# Patient Record
Sex: Female | Born: 1945
Health system: Southern US, Community
[De-identification: ages and names within clinical notes are randomized; demographics above are authoritative.]

## PROBLEM LIST (undated history)

## (undated) DIAGNOSIS — M8580 Other specified disorders of bone density and structure, unspecified site: Secondary | ICD-10-CM

## (undated) DIAGNOSIS — F32A Depression, unspecified: Secondary | ICD-10-CM

## (undated) DIAGNOSIS — D219 Benign neoplasm of connective and other soft tissue, unspecified: Secondary | ICD-10-CM

## (undated) DIAGNOSIS — G43909 Migraine, unspecified, not intractable, without status migrainosus: Secondary | ICD-10-CM

## (undated) DIAGNOSIS — F419 Anxiety disorder, unspecified: Secondary | ICD-10-CM

## (undated) DIAGNOSIS — C4491 Basal cell carcinoma of skin, unspecified: Secondary | ICD-10-CM

## (undated) DIAGNOSIS — K222 Esophageal obstruction: Secondary | ICD-10-CM

## (undated) DIAGNOSIS — Z9889 Other specified postprocedural states: Secondary | ICD-10-CM

## (undated) DIAGNOSIS — R112 Nausea with vomiting, unspecified: Secondary | ICD-10-CM

## (undated) DIAGNOSIS — I10 Essential (primary) hypertension: Secondary | ICD-10-CM

## (undated) DIAGNOSIS — K449 Diaphragmatic hernia without obstruction or gangrene: Secondary | ICD-10-CM

## (undated) DIAGNOSIS — F329 Major depressive disorder, single episode, unspecified: Secondary | ICD-10-CM

## (undated) DIAGNOSIS — K219 Gastro-esophageal reflux disease without esophagitis: Secondary | ICD-10-CM

## (undated) HISTORY — DX: Depression, unspecified: F32.A

## (undated) HISTORY — DX: Esophageal obstruction: K22.2

## (undated) HISTORY — DX: Migraine, unspecified, not intractable, without status migrainosus: G43.909

## (undated) HISTORY — DX: Benign neoplasm of connective and other soft tissue, unspecified: D21.9

## (undated) HISTORY — DX: Basal cell carcinoma of skin, unspecified: C44.91

## (undated) HISTORY — DX: Other specified disorders of bone density and structure, unspecified site: M85.80

## (undated) HISTORY — DX: Major depressive disorder, single episode, unspecified: F32.9

## (undated) HISTORY — PX: ESOPHAGEAL DILATION: SHX303

## (undated) HISTORY — PX: PARTIAL HYSTERECTOMY: SHX80

---

## 2000-02-16 ENCOUNTER — Encounter: Payer: Self-pay | Admitting: Internal Medicine

## 2000-02-16 ENCOUNTER — Ambulatory Visit (HOSPITAL_COMMUNITY): Admission: RE | Admit: 2000-02-16 | Discharge: 2000-02-16 | Payer: Self-pay | Admitting: Internal Medicine

## 2001-01-20 ENCOUNTER — Other Ambulatory Visit: Admission: RE | Admit: 2001-01-20 | Discharge: 2001-01-20 | Payer: Self-pay | Admitting: Obstetrics and Gynecology

## 2001-07-02 ENCOUNTER — Encounter: Admission: RE | Admit: 2001-07-02 | Discharge: 2001-07-02 | Payer: Self-pay | Admitting: Otolaryngology

## 2001-07-02 ENCOUNTER — Encounter: Payer: Self-pay | Admitting: Otolaryngology

## 2002-01-22 ENCOUNTER — Other Ambulatory Visit: Admission: RE | Admit: 2002-01-22 | Discharge: 2002-01-22 | Payer: Self-pay | Admitting: Obstetrics and Gynecology

## 2003-02-22 ENCOUNTER — Other Ambulatory Visit: Admission: RE | Admit: 2003-02-22 | Discharge: 2003-02-22 | Payer: Self-pay | Admitting: Obstetrics and Gynecology

## 2003-04-26 ENCOUNTER — Encounter: Payer: Self-pay | Admitting: Internal Medicine

## 2004-04-17 ENCOUNTER — Ambulatory Visit: Payer: Self-pay | Admitting: *Deleted

## 2004-04-28 ENCOUNTER — Ambulatory Visit: Payer: Self-pay | Admitting: *Deleted

## 2004-05-08 ENCOUNTER — Ambulatory Visit: Payer: Self-pay | Admitting: *Deleted

## 2004-05-15 ENCOUNTER — Ambulatory Visit: Payer: Self-pay | Admitting: Internal Medicine

## 2004-05-15 ENCOUNTER — Ambulatory Visit: Payer: Self-pay | Admitting: *Deleted

## 2004-05-23 ENCOUNTER — Ambulatory Visit: Payer: Self-pay | Admitting: *Deleted

## 2004-05-30 ENCOUNTER — Ambulatory Visit: Payer: Self-pay | Admitting: *Deleted

## 2004-06-19 ENCOUNTER — Ambulatory Visit: Payer: Self-pay | Admitting: Internal Medicine

## 2004-06-19 ENCOUNTER — Ambulatory Visit: Payer: Self-pay | Admitting: *Deleted

## 2004-06-26 ENCOUNTER — Ambulatory Visit: Payer: Self-pay | Admitting: *Deleted

## 2004-06-30 ENCOUNTER — Ambulatory Visit: Payer: Self-pay | Admitting: Internal Medicine

## 2004-08-21 ENCOUNTER — Ambulatory Visit: Payer: Self-pay | Admitting: Internal Medicine

## 2006-06-28 ENCOUNTER — Ambulatory Visit: Payer: Self-pay | Admitting: Internal Medicine

## 2006-07-11 ENCOUNTER — Ambulatory Visit: Payer: Self-pay | Admitting: Internal Medicine

## 2006-11-16 ENCOUNTER — Emergency Department (HOSPITAL_COMMUNITY): Admission: EM | Admit: 2006-11-16 | Discharge: 2006-11-16 | Payer: Self-pay | Admitting: Emergency Medicine

## 2006-11-16 ENCOUNTER — Ambulatory Visit: Payer: Self-pay | Admitting: Surgery

## 2006-11-16 ENCOUNTER — Encounter (INDEPENDENT_AMBULATORY_CARE_PROVIDER_SITE_OTHER): Payer: Self-pay | Admitting: Emergency Medicine

## 2007-06-09 ENCOUNTER — Encounter: Payer: Self-pay | Admitting: Internal Medicine

## 2011-09-13 ENCOUNTER — Emergency Department (HOSPITAL_COMMUNITY)
Admission: EM | Admit: 2011-09-13 | Discharge: 2011-09-13 | Disposition: A | Discharge status: Home / Self Care | Payer: Medicare Other | Attending: Emergency Medicine | Admitting: Emergency Medicine

## 2011-09-13 ENCOUNTER — Encounter (HOSPITAL_COMMUNITY): Payer: Self-pay | Admitting: *Deleted

## 2011-09-13 ENCOUNTER — Emergency Department (HOSPITAL_COMMUNITY): Payer: Medicare Other

## 2011-09-13 DIAGNOSIS — I10 Essential (primary) hypertension: Secondary | ICD-10-CM | POA: Insufficient documentation

## 2011-09-13 DIAGNOSIS — R1013 Epigastric pain: Secondary | ICD-10-CM

## 2011-09-13 DIAGNOSIS — F411 Generalized anxiety disorder: Secondary | ICD-10-CM | POA: Insufficient documentation

## 2011-09-13 DIAGNOSIS — Z79899 Other long term (current) drug therapy: Secondary | ICD-10-CM | POA: Insufficient documentation

## 2011-09-13 DIAGNOSIS — K219 Gastro-esophageal reflux disease without esophagitis: Secondary | ICD-10-CM | POA: Insufficient documentation

## 2011-09-13 HISTORY — DX: Gastro-esophageal reflux disease without esophagitis: K21.9

## 2011-09-13 HISTORY — DX: Essential (primary) hypertension: I10

## 2011-09-13 HISTORY — DX: Diaphragmatic hernia without obstruction or gangrene: K44.9

## 2011-09-13 HISTORY — DX: Anxiety disorder, unspecified: F41.9

## 2011-09-13 LAB — CBC
HCT: 40.2 % (ref 36.0–46.0)
MCH: 29 pg (ref 26.0–34.0)
MCV: 87 fL (ref 78.0–100.0)
Platelets: 287 10*3/uL (ref 150–400)
RBC: 4.62 MIL/uL (ref 3.87–5.11)
WBC: 7.3 10*3/uL (ref 4.0–10.5)

## 2011-09-13 LAB — HEPATIC FUNCTION PANEL
ALT: 13 U/L (ref 0–35)
AST: 24 U/L (ref 0–37)
Albumin: 4.9 g/dL (ref 3.5–5.2)
Alkaline Phosphatase: 100 U/L (ref 39–117)
Total Bilirubin: 0.4 mg/dL (ref 0.3–1.2)
Total Protein: 8.3 g/dL (ref 6.0–8.3)

## 2011-09-13 LAB — BASIC METABOLIC PANEL
BUN: 14 mg/dL (ref 6–23)
CO2: 31 mEq/L (ref 19–32)
Calcium: 10.4 mg/dL (ref 8.4–10.5)
Chloride: 98 mEq/L (ref 96–112)
Creatinine, Ser: 0.85 mg/dL (ref 0.50–1.10)
Glucose, Bld: 102 mg/dL — ABNORMAL HIGH (ref 70–99)

## 2011-09-13 LAB — POCT I-STAT TROPONIN I: Troponin i, poc: 0.01 ng/mL (ref 0.00–0.08)

## 2011-09-13 MED ORDER — GI COCKTAIL ~~LOC~~
30.0000 mL | Freq: Once | ORAL | Status: AC
  Administered 2011-09-13: 30 mL via ORAL
  Filled 2011-09-13: qty 30

## 2011-09-13 NOTE — ED Provider Notes (Signed)
History     CSN: [id number]  Arrival date & time 09/13/11  1245   First MD Initiated Contact with Patient 09/13/11 1709      Chief Complaint  Patient presents with  . Heartburn  . Chest Pain    In addition to what is listed below. Patient is a 66 year old female with past medical history relevant for hypertension, hiatal hernia, anxiety who presents with two-week history of epigastric discomfort.  Patient states that for 2 weeks she has had constant, indigestion-like feeling, in her epigastric region. Associated symptoms include bad taste in the back of her mouth but no others. Patient reports being diagnosed with hiatal hernia 15 years ago and current presentation is similar other than now it's more severe and longer in duration. Patient seen at outside clinic and there was concern for possible cardiac etiology. Thus she was sent to the emergency department  (Consider location/radiation/quality/duration/timing/severity/associated sxs/prior treatment) Patient is a 66 y.o. female presenting with abdominal pain. The history is provided by the patient and medical records.  Abdominal Pain The primary symptoms of the illness include abdominal pain. The current episode started more than 2 days ago. The onset of the illness was gradual. The problem has not changed since onset. The abdominal pain began more than 2 days ago. The pain came on gradually. The abdominal pain has been gradually worsening since its onset. The abdominal pain is located in the epigastric region. The abdominal pain does not radiate. The severity of the abdominal pain is 3/10. The abdominal pain is exacerbated by fatty foods and certain positions.  The patient states that she believes she is currently not pregnant. The patient has not had a change in bowel habit. Additional symptoms associated with the illness include heartburn.    Past Medical History  Diagnosis Date  . Hypertension   . GERD (gastroesophageal reflux  disease)   . Hiatal hernia   . Anxiety     Past Surgical History  Procedure Date  . Partial hysterectomy   . Esophageal dilation     No family history on file.  History  Substance Use Topics  . Smoking status: Never Smoker   . Smokeless tobacco: Not on file  . Alcohol Use: No    OB History    Grav Para Term Preterm Abortions TAB SAB Ect Mult Living                  Review of Systems  Gastrointestinal: Positive for heartburn and abdominal pain.  All other systems reviewed and are negative.    Allergies  Augmentin and Lipitor  Home Medications   Current Outpatient Rx  Name Route Sig Dispense Refill  . VITAMIN D 1000 UNITS PO TABS Oral Take 1,000 Units by mouth daily.    Marland Kitchen LISINOPRIL-HYDROCHLOROTHIAZIDE 10-12.5 MG PO TABS Oral Take 1 tablet by mouth daily.    Marland Kitchen FISH OIL 500 MG PO CAPS Oral Take 1 capsule by mouth 2 (two) times daily.    Marland Kitchen OMEPRAZOLE 20 MG PO CPDR Oral Take 20 mg by mouth daily.    Marland Kitchen PRAVASTATIN SODIUM 40 MG PO TABS Oral Take 40 mg by mouth daily.    . VENLAFAXINE HCL ER 37.5 MG PO CP24 Oral Take 37.5 mg by mouth daily.    Marland Kitchen ZOLPIDEM TARTRATE 5 MG PO TABS Oral Take 5 mg by mouth at bedtime as needed. For sleep      BP 149/66  Pulse 75  Temp 98.4 F (36.9 C) (Oral)  Resp 16  Ht 5\' 4"  (1.626 m)  Wt 130 lb (58.968 kg)  BMI 22.31 kg/m2  SpO2 100%  Physical Exam  Nursing note and vitals reviewed. Constitutional: She is oriented to person, place, and time. She appears well-developed and well-nourished. No distress.  HENT:  Head: Normocephalic and atraumatic.  Right Ear: External ear normal.  Left Ear: External ear normal.  Nose: Nose normal.  Mouth/Throat: Oropharynx is clear and moist.  Eyes: Conjunctivae normal and EOM are normal. Pupils are equal, round, and reactive to light.  Neck: Normal range of motion. Neck supple.  Cardiovascular: Normal rate, regular rhythm, normal heart sounds and intact distal pulses.   No murmur  heard. Pulmonary/Chest: Effort normal and breath sounds normal.  Abdominal: Soft. Bowel sounds are normal. She exhibits no distension and no mass. There is tenderness (mild TTP over epigastric region). There is no rebound and no guarding.  Musculoskeletal: Normal range of motion. She exhibits no edema and no tenderness.  Neurological: She is alert and oriented to person, place, and time. She has normal reflexes.  Skin: Skin is warm and dry.  Psychiatric: She has a normal mood and affect.    ED Course  Procedures (including critical care time)  Labs Reviewed  BASIC METABOLIC PANEL - Abnormal; Notable for the following:    Potassium 3.4 (*)     Glucose, Bld 102 (*)     GFR calc non Af Amer 69 (*)     GFR calc Af Amer 80 (*)     All other components within normal limits  CBC  POCT I-STAT TROPONIN I  LIPASE, BLOOD  HEPATIC FUNCTION PANEL  LAB REPORT - SCANNED   Dg Chest 2 View  09/13/2011  *RADIOLOGY REPORT*  Clinical Data: Intermittent chest pressure and heartburn for the past 2 weeks.  CHEST - 2 VIEW  Comparison: None.  Findings: Normal sized heart.  Clear lungs.  The lungs are hyperexpanded with mildly prominent interstitial markings. Thoracic spine degenerative changes.  The bones appear osteopenic.  IMPRESSION:  1.  No acute abnormality. 2.  COPD.   Original Report Authenticated By: Darrol Angel, M.D.       Date: 09/13/2011  Rate: 82  Rhythm: normal sinus rhythm  QRS Axis: normal  Intervals: normal  ST/T Wave abnormalities: normal  Conduction Disutrbances:none  Narrative Interpretation:   Old EKG Reviewed: none available    1. Epigastric abdominal pain       MDM    Patient presents with epigastric discomfort for appx 2 weeks.  Seen at PCP's office prior to arrival and patient sent to ED for ACS evaluation.  AF and VS wnl.  On exam, patient with mild epigastric TTP but otherwise unremarkable.  Due to age, risk factors, and concern for atypical presentation for ACS  it was felt that workup was necessary.  EKG wnl.  CXR showed no acute cardiopulmonary abnormality.  Labs including, 4 hour delta troponin, were wnl.  Patient given GI cocktail which helped decrease symptoms.   Etiology of patient's symptoms not felt to be attributed to cardiac etiology.  More likely to be related to hiatal hernia/GI pathology.  Thus, patient instructed to continue PPI and follow up with PCP as soon as possible.  Patient discharged without acute events.         Johnney Ou, MD 09/14/11 [phone]

## 2011-09-13 NOTE — ED Notes (Addendum)
Patient reports she has had what she thought was  Hiatal hernia sx for 2 weeks.  She was seen today at [facility] clinic and advised to come to ED for cardiac eval.  Patient denies sob,  Denies n/v.  Patient has had periods of light headedness.  Patient denies feeling palpitations.  Patient has had upper back pain.  Patient states she has a bad taste in her mouth,  She has not eaten much due to increased sx when she eats

## 2011-09-13 NOTE — ED Notes (Signed)
Pt from [facility] in [place], was sent here by private vehicle for r/o CP. Elita Quick, PA at [facility] has spoken with Dr. Ignacia Palma and will be faxing office records.

## 2011-09-13 NOTE — ED Notes (Signed)
Patient remains alert and oriented.  No s/sx of distress.  Update with info noted.  Patient reports her MD should have faxed over info along with chest xray results

## 2011-09-17 NOTE — ED Provider Notes (Signed)
I saw and evaluated the patient, reviewed the resident's note and I agree with the findings and plan and agree with their ECG interpretation. Chest pain/epigastric pain. Doubt cardiac cause. Will d/c with follow up  Megan Mason. Rubin Payor, MD 09/17/11 1537

## 2012-01-02 HISTORY — PX: APPENDECTOMY: SHX54

## 2012-02-07 ENCOUNTER — Encounter: Payer: Self-pay | Admitting: Internal Medicine

## 2012-02-07 ENCOUNTER — Ambulatory Visit (INDEPENDENT_AMBULATORY_CARE_PROVIDER_SITE_OTHER): Payer: Medicare Other | Admitting: Internal Medicine

## 2012-02-07 VITALS — BP 106/62 | HR 60 | Ht 64.5 in | Wt 130.8 lb

## 2012-02-07 DIAGNOSIS — R438 Other disturbances of smell and taste: Secondary | ICD-10-CM

## 2012-02-07 DIAGNOSIS — R439 Unspecified disturbances of smell and taste: Secondary | ICD-10-CM

## 2012-02-07 DIAGNOSIS — K219 Gastro-esophageal reflux disease without esophagitis: Secondary | ICD-10-CM

## 2012-02-07 DIAGNOSIS — K589 Irritable bowel syndrome without diarrhea: Secondary | ICD-10-CM

## 2012-02-07 DIAGNOSIS — K222 Esophageal obstruction: Secondary | ICD-10-CM

## 2012-02-07 NOTE — Patient Instructions (Addendum)
Please follow up as needed 

## 2012-02-07 NOTE — Progress Notes (Signed)
HISTORY OF PRESENT ILLNESS:  Megan Mason is a 67 y.o. female with hypertension, anxiety, depression, migraine headaches, IBS, and GERD complicated by peptic stricture for which she underwent dilation in 2002. She is sent today regarding "a bad taste in my mouth". The patient states that this is her chief complaint. She is somewhat vague, but states that this has been going on for at least 8 years. No water brash, thrush, or new medications. She tells me that she saw him as and throat specialist last summer and was felt possibly to have GERD. PPIs have not improved this complaint. She also has ongoing chronic IBS symptoms as manifested by abdominal cramping with urgency and loose stools, often after meals. She also mentioned some occasional epigastric and substernal burning over the past 6 months. No dysphagia. No weight loss. No bleeding. She did undergo complete colonoscopy for the purposes of screening in July of 2008. This was normal except for sigmoid diverticulosis. She denies a history of neurologic problems. She has not had a neurology evaluation for this complaint. She tells me that she has had "lots of blood work" which has been unrevealing. The only outside record is an office note from September 2013. Testing for Helicobacter pylori was negative. Apparently complaining of abdominal pain at that time. She was also seen in the emergency room in September complaining of bad taste in her mouth and epigastric discomfort. Cardiac workup was negative. In addition, CBC, and comprehensive metabolic panel were unremarkable.  REVIEW OF SYSTEMS:  All non-GI ROS negative except for headaches  Past Medical History  Diagnosis Date  . Hypertension   . GERD (gastroesophageal reflux disease)   . Hiatal hernia   . Anxiety   . Migraines   . Depression   . Fibroids   . Basal cell carcinoma   . Osteopenia   . Esophageal stricture     Past Surgical History  Procedure Date  . Partial hysterectomy   .  Esophageal dilation     Social History Jeanenne C Flythe  reports that she has never smoked. She has never used smokeless tobacco. She reports that she does not drink alcohol or use illicit drugs.  family history includes Diabetes in an unspecified family member; Hyperlipidemia in an unspecified family member; and Stroke in her mother.  Allergies  Allergen Reactions  . Augmentin (Amoxicillin-Pot Clavulanate) Other (See Comments)    Made me feel funny  . Lipitor (Atorvastatin) Other (See Comments)    Made me feel funny       PHYSICAL EXAMINATION: Vital signs: BP 106/62  Pulse 60  Ht 5' 4.5" (1.638 m)  Wt 130 lb 12.8 oz (59.33 kg)  BMI 22.10 kg/m2  Constitutional: generally well-appearing, no acute distress Psychiatric: alert and oriented x3, cooperative Eyes: extraocular movements intact, anicteric, conjunctiva pink Mouth: oral pharynx moist, no lesions. No thrush or other abnormalities. Neck: supple no lymphadenopathy Cardiovascular: heart regular rate and rhythm, no murmur Lungs: clear to auscultation bilaterally Abdomen: soft, nontender, nondistended, no obvious ascites, no peritoneal signs, normal bowel sounds, no organomegaly Rectal: Omitted Extremities: no lower extremity edema bilaterally Skin: no lesions on visible extremities Neuro: Cranial nerves intact. No focal deficits. No asterixis.     ASSESSMENT:  #1. Disorder of taste. Not a primary GI disorder. Not a symptom of GERD. Has had previous ENT evaluation. If symptom is organic, maybe related to something neurologic or medication side effect. Could be functional. #2. GERD. Seems to be having some breakthrough symptoms. #3. History  of peptic stricture requiring esophageal dilation. No recurrent dysphagia #4. Diarrhea predominant IBS. Ongoing #5. Negative screening colonoscopy in 2008 (save diverticulosis)   PLAN:  #1. Return to primary provider regarding disorder of taste. Could consider neurologic  assessment #2. Reflux precautions #3. Increase omeprazole to 20 mg twice a day #4. Routine repeat screening colonoscopy around 2018. Interval GI followup as needed

## 2012-08-06 ENCOUNTER — Other Ambulatory Visit: Payer: Self-pay

## 2012-11-06 ENCOUNTER — Other Ambulatory Visit: Payer: Self-pay

## 2013-08-19 ENCOUNTER — Encounter (HOSPITAL_COMMUNITY): Payer: Self-pay | Admitting: Emergency Medicine

## 2013-08-19 ENCOUNTER — Inpatient Hospital Stay: Admit: 2013-08-19 | Payer: Self-pay | Admitting: General Surgery

## 2013-08-19 ENCOUNTER — Observation Stay (HOSPITAL_COMMUNITY): Payer: Medicare Other | Admitting: Anesthesiology

## 2013-08-19 ENCOUNTER — Encounter (HOSPITAL_COMMUNITY)
Admission: EM | Disposition: A | Discharge status: Home / Self Care | Payer: Self-pay | Source: Home / Self Care | Attending: Emergency Medicine

## 2013-08-19 ENCOUNTER — Observation Stay (HOSPITAL_COMMUNITY)
Admission: EM | Admit: 2013-08-19 | Discharge: 2013-08-20 | Disposition: A | Discharge status: Home / Self Care | Payer: Medicare Other | Attending: Surgery | Admitting: Surgery

## 2013-08-19 ENCOUNTER — Encounter (HOSPITAL_COMMUNITY): Payer: Medicare Other | Admitting: Anesthesiology

## 2013-08-19 ENCOUNTER — Emergency Department (HOSPITAL_COMMUNITY): Payer: Medicare Other

## 2013-08-19 DIAGNOSIS — F3289 Other specified depressive episodes: Secondary | ICD-10-CM | POA: Diagnosis not present

## 2013-08-19 DIAGNOSIS — F411 Generalized anxiety disorder: Secondary | ICD-10-CM | POA: Insufficient documentation

## 2013-08-19 DIAGNOSIS — F329 Major depressive disorder, single episode, unspecified: Secondary | ICD-10-CM | POA: Diagnosis not present

## 2013-08-19 DIAGNOSIS — D72829 Elevated white blood cell count, unspecified: Secondary | ICD-10-CM

## 2013-08-19 DIAGNOSIS — Z85828 Personal history of other malignant neoplasm of skin: Secondary | ICD-10-CM | POA: Diagnosis not present

## 2013-08-19 DIAGNOSIS — K353 Acute appendicitis with localized peritonitis, without perforation or gangrene: Secondary | ICD-10-CM

## 2013-08-19 DIAGNOSIS — K449 Diaphragmatic hernia without obstruction or gangrene: Secondary | ICD-10-CM | POA: Insufficient documentation

## 2013-08-19 DIAGNOSIS — K3580 Unspecified acute appendicitis: Principal | ICD-10-CM | POA: Diagnosis present

## 2013-08-19 DIAGNOSIS — Z90710 Acquired absence of both cervix and uterus: Secondary | ICD-10-CM | POA: Insufficient documentation

## 2013-08-19 DIAGNOSIS — R1031 Right lower quadrant pain: Secondary | ICD-10-CM | POA: Diagnosis present

## 2013-08-19 DIAGNOSIS — K219 Gastro-esophageal reflux disease without esophagitis: Secondary | ICD-10-CM | POA: Insufficient documentation

## 2013-08-19 DIAGNOSIS — I10 Essential (primary) hypertension: Secondary | ICD-10-CM | POA: Diagnosis not present

## 2013-08-19 DIAGNOSIS — K3533 Acute appendicitis with perforation and localized peritonitis, with abscess: Secondary | ICD-10-CM | POA: Diagnosis present

## 2013-08-19 HISTORY — PX: LAPAROSCOPIC APPENDECTOMY: SHX408

## 2013-08-19 HISTORY — DX: Other specified postprocedural states: Z98.890

## 2013-08-19 HISTORY — DX: Nausea with vomiting, unspecified: R11.2

## 2013-08-19 LAB — I-STAT TROPONIN, ED: Troponin i, poc: 0.01 ng/mL (ref 0.00–0.08)

## 2013-08-19 LAB — COMPREHENSIVE METABOLIC PANEL
ALBUMIN: 4.5 g/dL (ref 3.5–5.2)
ALK PHOS: 78 U/L (ref 39–117)
ALT: 31 U/L (ref 0–35)
ANION GAP: 14 (ref 5–15)
AST: 36 U/L (ref 0–37)
BILIRUBIN TOTAL: 0.6 mg/dL (ref 0.3–1.2)
BUN: 16 mg/dL (ref 6–23)
CHLORIDE: 103 meq/L (ref 96–112)
CO2: 27 mEq/L (ref 19–32)
Calcium: 9.9 mg/dL (ref 8.4–10.5)
Creatinine, Ser: 0.99 mg/dL (ref 0.50–1.10)
GFR calc Af Amer: 66 mL/min — ABNORMAL LOW (ref >90)
GFR calc non Af Amer: 57 mL/min — ABNORMAL LOW (ref >90)
GLUCOSE: 104 mg/dL — AB (ref 70–99)
POTASSIUM: 3.5 meq/L — AB (ref 3.7–5.3)
Sodium: 144 mEq/L (ref 137–147)
Total Protein: 8.1 g/dL (ref 6.0–8.3)

## 2013-08-19 LAB — URINALYSIS, ROUTINE W REFLEX MICROSCOPIC
BILIRUBIN URINE: NEGATIVE
Glucose, UA: NEGATIVE mg/dL
HGB URINE DIPSTICK: NEGATIVE
KETONES UR: NEGATIVE mg/dL
Leukocytes, UA: NEGATIVE
NITRITE: NEGATIVE
PH: 8 (ref 5.0–8.0)
Protein, ur: NEGATIVE mg/dL
Specific Gravity, Urine: 1.016 (ref 1.005–1.030)
Urobilinogen, UA: 1 mg/dL (ref 0.0–1.0)

## 2013-08-19 LAB — CBC WITH DIFFERENTIAL/PLATELET
BASOS PCT: 0 % (ref 0–1)
Basophils Absolute: 0 10*3/uL (ref 0.0–0.1)
Eosinophils Absolute: 0.2 10*3/uL (ref 0.0–0.7)
Eosinophils Relative: 1 % (ref 0–5)
HCT: 40.5 % (ref 36.0–46.0)
HEMOGLOBIN: 13 g/dL (ref 12.0–15.0)
LYMPHS ABS: 1.7 10*3/uL (ref 0.7–4.0)
Lymphocytes Relative: 12 % (ref 12–46)
MCH: 28.4 pg (ref 26.0–34.0)
MCHC: 32.1 g/dL (ref 30.0–36.0)
MCV: 88.4 fL (ref 78.0–100.0)
MONOS PCT: 6 % (ref 3–12)
Monocytes Absolute: 0.8 10*3/uL (ref 0.1–1.0)
NEUTROS ABS: 11.8 10*3/uL — AB (ref 1.7–7.7)
NEUTROS PCT: 81 % — AB (ref 43–77)
Platelets: 251 10*3/uL (ref 150–400)
RBC: 4.58 MIL/uL (ref 3.87–5.11)
RDW: 12.6 % (ref 11.5–15.5)
WBC: 14.4 10*3/uL — ABNORMAL HIGH (ref 4.0–10.5)

## 2013-08-19 LAB — LIPASE, BLOOD: Lipase: 29 U/L (ref 11–59)

## 2013-08-19 LAB — SURGICAL PCR SCREEN
MRSA, PCR: NEGATIVE
STAPHYLOCOCCUS AUREUS: NEGATIVE

## 2013-08-19 LAB — PROTIME-INR
INR: 0.99 (ref 0.00–1.49)
Prothrombin Time: 13.1 seconds (ref 11.6–15.2)

## 2013-08-19 LAB — APTT: aPTT: 25 seconds (ref 24–37)

## 2013-08-19 SURGERY — APPENDECTOMY, LAPAROSCOPIC
Anesthesia: General | Site: Abdomen

## 2013-08-19 MED ORDER — HYDROMORPHONE HCL PF 1 MG/ML IJ SOLN: 0.2500 mg | INTRAMUSCULAR | Status: DC | PRN

## 2013-08-19 MED ORDER — ROCURONIUM BROMIDE 100 MG/10ML IV SOLN
INTRAVENOUS | Status: AC
  Filled 2013-08-19: qty 1

## 2013-08-19 MED ORDER — ONDANSETRON HCL 4 MG/2ML IJ SOLN: 4.0000 mg | Freq: Four times a day (QID) | INTRAMUSCULAR | Status: DC | PRN

## 2013-08-19 MED ORDER — GLYCOPYRROLATE 0.2 MG/ML IJ SOLN
INTRAMUSCULAR | Status: DC | PRN
  Administered 2013-08-19: 0.6 mg via INTRAVENOUS

## 2013-08-19 MED ORDER — POTASSIUM CHLORIDE IN NACL 20-0.9 MEQ/L-% IV SOLN
INTRAVENOUS | Status: DC
  Administered 2013-08-19: 10:00:00 via INTRAVENOUS
  Administered 2013-08-20: 100 mL/h via INTRAVENOUS
  Filled 2013-08-19 (×5): qty 1000

## 2013-08-19 MED ORDER — ZOLPIDEM TARTRATE 5 MG PO TABS
2.5000 mg | ORAL_TABLET | Freq: Every evening | ORAL | Status: DC | PRN
  Administered 2013-08-19: 2.5 mg via ORAL
  Filled 2013-08-19: qty 1

## 2013-08-19 MED ORDER — ACETAMINOPHEN 10 MG/ML IV SOLN
1000.0000 mg | Freq: Once | INTRAVENOUS | Status: AC
  Administered 2013-08-19: 1000 mg via INTRAVENOUS
  Filled 2013-08-19: qty 100

## 2013-08-19 MED ORDER — PROPOFOL 10 MG/ML IV BOLUS
INTRAVENOUS | Status: AC
  Filled 2013-08-19: qty 20

## 2013-08-19 MED ORDER — DEXTROSE 5 % IV SOLN
1.0000 g | Freq: Four times a day (QID) | INTRAVENOUS | Status: DC
  Administered 2013-08-19 – 2013-08-20 (×4): 1 g via INTRAVENOUS
  Filled 2013-08-19 (×7): qty 1

## 2013-08-19 MED ORDER — LACTATED RINGERS IV SOLN
INTRAVENOUS | Status: DC | PRN
  Administered 2013-08-19: 12:00:00 via INTRAVENOUS

## 2013-08-19 MED ORDER — PANTOPRAZOLE SODIUM 40 MG IV SOLR
40.0000 mg | Freq: Every day | INTRAVENOUS | Status: DC
  Administered 2013-08-19: 40 mg via INTRAVENOUS
  Filled 2013-08-19 (×2): qty 40

## 2013-08-19 MED ORDER — HYDROCODONE-ACETAMINOPHEN 5-325 MG PO TABS: 1.0000 | ORAL_TABLET | ORAL | Status: DC | PRN

## 2013-08-19 MED ORDER — BUPIVACAINE HCL (PF) 0.5 % IJ SOLN
INTRAMUSCULAR | Status: AC
  Filled 2013-08-19: qty 30

## 2013-08-19 MED ORDER — LACTATED RINGERS IV SOLN
INTRAVENOUS | Status: DC
  Administered 2013-08-19: 15:00:00 via INTRAVENOUS

## 2013-08-19 MED ORDER — NEOSTIGMINE METHYLSULFATE 10 MG/10ML IV SOLN
INTRAVENOUS | Status: DC | PRN
  Administered 2013-08-19: 4 mg via INTRAVENOUS

## 2013-08-19 MED ORDER — FENTANYL CITRATE 0.05 MG/ML IJ SOLN
INTRAMUSCULAR | Status: DC | PRN
  Administered 2013-08-19: 50 ug via INTRAVENOUS
  Administered 2013-08-19 (×2): 100 ug via INTRAVENOUS

## 2013-08-19 MED ORDER — VENLAFAXINE HCL ER 37.5 MG PO CP24
37.5000 mg | ORAL_CAPSULE | Freq: Every day | ORAL | Status: DC
  Administered 2013-08-20: 37.5 mg via ORAL
  Filled 2013-08-19 (×2): qty 1

## 2013-08-19 MED ORDER — ONDANSETRON HCL 4 MG/2ML IJ SOLN
4.0000 mg | Freq: Once | INTRAMUSCULAR | Status: AC
  Administered 2013-08-19: 4 mg via INTRAVENOUS
  Filled 2013-08-19: qty 2

## 2013-08-19 MED ORDER — DIPHENHYDRAMINE HCL 12.5 MG/5ML PO ELIX: 12.5000 mg | ORAL_SOLUTION | Freq: Four times a day (QID) | ORAL | Status: DC | PRN

## 2013-08-19 MED ORDER — ONDANSETRON HCL 4 MG/2ML IJ SOLN
INTRAMUSCULAR | Status: AC
  Filled 2013-08-19: qty 2

## 2013-08-19 MED ORDER — PROPOFOL 10 MG/ML IV BOLUS
INTRAVENOUS | Status: DC | PRN
  Administered 2013-08-19: 140 mg via INTRAVENOUS

## 2013-08-19 MED ORDER — ACETAMINOPHEN 325 MG PO TABS: 650.0000 mg | ORAL_TABLET | Freq: Four times a day (QID) | ORAL | Status: DC | PRN

## 2013-08-19 MED ORDER — BUPIVACAINE HCL (PF) 0.5 % IJ SOLN
INTRAMUSCULAR | Status: DC | PRN
  Administered 2013-08-19: 15 mL

## 2013-08-19 MED ORDER — MORPHINE SULFATE 4 MG/ML IJ SOLN
4.0000 mg | Freq: Once | INTRAMUSCULAR | Status: AC
  Administered 2013-08-19: 4 mg via INTRAVENOUS
  Filled 2013-08-19: qty 1

## 2013-08-19 MED ORDER — IOHEXOL 300 MG/ML  SOLN
100.0000 mL | Freq: Once | INTRAMUSCULAR | Status: AC | PRN
  Administered 2013-08-19: 100 mL via INTRAVENOUS

## 2013-08-19 MED ORDER — LIDOCAINE HCL (CARDIAC) 20 MG/ML IV SOLN
INTRAVENOUS | Status: AC
  Filled 2013-08-19: qty 5

## 2013-08-19 MED ORDER — FENTANYL CITRATE 0.05 MG/ML IJ SOLN
INTRAMUSCULAR | Status: AC
Start: 2013-08-19 — End: 2013-08-19
  Filled 2013-08-19: qty 5

## 2013-08-19 MED ORDER — DIPHENHYDRAMINE HCL 50 MG/ML IJ SOLN: 12.5000 mg | Freq: Four times a day (QID) | INTRAMUSCULAR | Status: DC | PRN

## 2013-08-19 MED ORDER — DEXTROSE 5 % IV SOLN
2.0000 g | INTRAVENOUS | Status: DC
  Filled 2013-08-19: qty 2

## 2013-08-19 MED ORDER — ROCURONIUM BROMIDE 100 MG/10ML IV SOLN
INTRAVENOUS | Status: DC | PRN
  Administered 2013-08-19: 10 mg via INTRAVENOUS
  Administered 2013-08-19: 30 mg via INTRAVENOUS

## 2013-08-19 MED ORDER — IOHEXOL 300 MG/ML  SOLN
50.0000 mL | Freq: Once | INTRAMUSCULAR | Status: AC | PRN
  Administered 2013-08-19: 50 mL via ORAL

## 2013-08-19 MED ORDER — MORPHINE SULFATE 2 MG/ML IJ SOLN: 1.0000 mg | INTRAMUSCULAR | Status: DC | PRN

## 2013-08-19 MED ORDER — LISINOPRIL-HYDROCHLOROTHIAZIDE 10-12.5 MG PO TABS: 1.0000 | ORAL_TABLET | Freq: Every day | ORAL | Status: DC

## 2013-08-19 MED ORDER — LISINOPRIL 10 MG PO TABS
10.0000 mg | ORAL_TABLET | Freq: Every day | ORAL | Status: DC
  Administered 2013-08-20: 10 mg via ORAL
  Filled 2013-08-19 (×2): qty 1

## 2013-08-19 MED ORDER — DEXAMETHASONE SODIUM PHOSPHATE 10 MG/ML IJ SOLN
INTRAMUSCULAR | Status: DC | PRN
  Administered 2013-08-19: 10 mg via INTRAVENOUS

## 2013-08-19 MED ORDER — SODIUM CHLORIDE 0.9 % IV BOLUS (SEPSIS)
500.0000 mL | Freq: Once | INTRAVENOUS | Status: AC
  Administered 2013-08-19: 500 mL via INTRAVENOUS

## 2013-08-19 MED ORDER — ONDANSETRON HCL 4 MG/2ML IJ SOLN
INTRAMUSCULAR | Status: DC | PRN
  Administered 2013-08-19: 4 mg via INTRAVENOUS

## 2013-08-19 MED ORDER — DEXTROSE 5 % IV SOLN
1.0000 g | INTRAVENOUS | Status: AC
  Administered 2013-08-19: 1 g via INTRAVENOUS
  Filled 2013-08-19: qty 1

## 2013-08-19 MED ORDER — DEXAMETHASONE SODIUM PHOSPHATE 10 MG/ML IJ SOLN
INTRAMUSCULAR | Status: AC
  Filled 2013-08-19: qty 1

## 2013-08-19 MED ORDER — LACTATED RINGERS IV SOLN
INTRAVENOUS | Status: DC | PRN
  Administered 2013-08-19: 800 mL via INTRAVENOUS

## 2013-08-19 MED ORDER — LIDOCAINE HCL (PF) 2 % IJ SOLN
INTRAMUSCULAR | Status: DC | PRN
  Administered 2013-08-19: 75 mg via INTRADERMAL

## 2013-08-19 MED ORDER — HYDROCHLOROTHIAZIDE 12.5 MG PO CAPS
12.5000 mg | ORAL_CAPSULE | Freq: Every day | ORAL | Status: DC
  Administered 2013-08-20: 12.5 mg via ORAL
  Filled 2013-08-19 (×2): qty 1

## 2013-08-19 MED ORDER — PROMETHAZINE HCL 25 MG/ML IJ SOLN
6.2500 mg | INTRAMUSCULAR | Status: DC | PRN
  Administered 2013-08-19: 12.5 mg via INTRAVENOUS
  Filled 2013-08-19: qty 1

## 2013-08-19 MED ORDER — ACETAMINOPHEN 650 MG RE SUPP: 650.0000 mg | Freq: Four times a day (QID) | RECTAL | Status: DC | PRN

## 2013-08-19 SURGICAL SUPPLY — 49 items
APL SKNCLS STERI-STRIP NONHPOA (GAUZE/BANDAGES/DRESSINGS) ×1
APPLIER CLIP 5 13 M/L LIGAMAX5 (MISCELLANEOUS)
APPLIER CLIP ROT 10 11.4 M/L (STAPLE)
APR CLP MED LRG 11.4X10 (STAPLE)
APR CLP MED LRG 5 ANG JAW (MISCELLANEOUS)
BAG SPEC RTRVL LRG 6X4 10 (ENDOMECHANICALS) ×1
BENZOIN TINCTURE PRP APPL 2/3 (GAUZE/BANDAGES/DRESSINGS) ×3 IMPLANT
CHLORAPREP W/TINT 26ML (MISCELLANEOUS) ×3 IMPLANT
CLIP APPLIE 5 13 M/L LIGAMAX5 (MISCELLANEOUS) IMPLANT
CLIP APPLIE ROT 10 11.4 M/L (STAPLE) IMPLANT
CLOSURE WOUND 1/2 X4 (GAUZE/BANDAGES/DRESSINGS) ×1
CUTTER FLEX LINEAR 45M (STAPLE) ×2 IMPLANT
DECANTER SPIKE VIAL GLASS SM (MISCELLANEOUS) ×3 IMPLANT
DRAPE LAPAROSCOPIC ABDOMINAL (DRAPES) ×3 IMPLANT
DRSG TEGADERM 2-3/8X2-3/4 SM (GAUZE/BANDAGES/DRESSINGS) ×4 IMPLANT
ELECT REM PT RETURN 9FT ADLT (ELECTROSURGICAL) ×3
ELECTRODE REM PT RTRN 9FT ADLT (ELECTROSURGICAL) ×1 IMPLANT
ENDOLOOP SUT PDS II  0 18 (SUTURE)
ENDOLOOP SUT PDS II 0 18 (SUTURE) IMPLANT
EVACUATOR SILICONE 100CC (DRAIN) IMPLANT
GAUZE SPONGE 2X2 8PLY STRL LF (GAUZE/BANDAGES/DRESSINGS) IMPLANT
GLOVE BIO SURGEON STRL SZ 6.5 (GLOVE) ×1 IMPLANT
GLOVE BIO SURGEON STRL SZ7 (GLOVE) ×2 IMPLANT
GLOVE BIO SURGEONS STRL SZ 6.5 (GLOVE) ×1
GLOVE ECLIPSE 8.0 STRL XLNG CF (GLOVE) ×3 IMPLANT
GLOVE INDICATOR 8.0 STRL GRN (GLOVE) ×3 IMPLANT
GLOVE SURG SS PI 6.0 STRL IVOR (GLOVE) ×2 IMPLANT
GOWN STRL REUS W/TWL XL LVL3 (GOWN DISPOSABLE) ×6 IMPLANT
KIT BASIN OR (CUSTOM PROCEDURE TRAY) ×3 IMPLANT
POUCH SPECIMEN RETRIEVAL 10MM (ENDOMECHANICALS) ×3 IMPLANT
RELOAD 45 VASCULAR/THIN (ENDOMECHANICALS) IMPLANT
RELOAD STAPLE 45 2.5 WHT GRN (ENDOMECHANICALS) IMPLANT
RELOAD STAPLE 45 3.5 BLU ETS (ENDOMECHANICALS) IMPLANT
RELOAD STAPLE TA45 3.5 REG BLU (ENDOMECHANICALS) ×3 IMPLANT
SET IRRIG TUBING LAPAROSCOPIC (IRRIGATION / IRRIGATOR) ×3 IMPLANT
SHEARS HARMONIC ACE PLUS 36CM (ENDOMECHANICALS) ×3 IMPLANT
SLEEVE XCEL OPT CAN 5 100 (ENDOMECHANICALS) ×3 IMPLANT
SOLUTION ANTI FOG 6CC (MISCELLANEOUS) ×3 IMPLANT
SPONGE GAUZE 2X2 STER 10/PKG (GAUZE/BANDAGES/DRESSINGS) ×2
STRIP CLOSURE SKIN 1/2X4 (GAUZE/BANDAGES/DRESSINGS) ×2 IMPLANT
SUT ETHILON 3 0 PS 1 (SUTURE) IMPLANT
SUT MNCRL AB 4-0 PS2 18 (SUTURE) ×3 IMPLANT
TOWEL OR 17X26 10 PK STRL BLUE (TOWEL DISPOSABLE) ×3 IMPLANT
TOWEL OR NON WOVEN STRL DISP B (DISPOSABLE) ×3 IMPLANT
TRAY FOLEY CATH 14FRSI W/METER (CATHETERS) ×3 IMPLANT
TRAY LAP CHOLE (CUSTOM PROCEDURE TRAY) ×3 IMPLANT
TROCAR BLADELESS OPT 5 100 (ENDOMECHANICALS) ×3 IMPLANT
TROCAR XCEL BLUNT TIP 100MML (ENDOMECHANICALS) ×3 IMPLANT
TUBING INSUFFLATION 10FT LAP (TUBING) ×3 IMPLANT

## 2013-08-19 NOTE — Op Note (Signed)
Appendectomy, Lap, Procedure Note  Pre-operative Diagnosis: Acute appendicitis without mention of peritonitis  Post-operative Diagnosis: Same  Procedure:  Laparoscopic appendectomy  Surgeon:  Jackolyn Confer, M.D.  Anesthesia:  General   Indications:  Megan Mason is a 68 year old female with acute appendicitis. She now presents for the above procedure.   Procedure Details   She was brought to the operating room, placed in the supine position and general anesthesia was induced, along with placement of orogastric tube, SCDs, and a Foley catheter. A timeout was performed. The abdomen was prepped and draped in a sterile fashion. A small infraumbilical incision was made through the skin, subcutaneous tissue, fascia, and peritoneum entering the peritoneal cavity under direct vision.  A pursestring suture was passed around the fascia with a 0 Vicryl.  The Hasson was introduced into the peritoneal cavity and the tails of the suture were used to hold the Hasson in place.   The pneumoperitoneum was then established to steady pressure of 15 mmHg.   The laparoscope was introduced and there was no evidence of bleeding or underlying organ injury.  There were some omental adhesions to the lower midline.   Additional 5 mm cannulas then placed in the left lower quadrant of the abdomen and the right upper quadrant region under direct visualization. A careful evaluation of the entire abdomen was carried out. The patient was placed in Trendelenburg and left lateral decubitus position. The small intestines were retracted in the cephalad and left lateral direction away from the pelvis and right lower quadrant. The patient was found to have an enlarged and inflamed appendix that was extending into the pelvis.  It was adherent to the small bowel mesentery. There was no evidence of perforation.  The appendix was carefully mobilized. The mesoappendix was was divided with the harmonic scalpel.   The appendix was  amputated off the cecum, with a small cuff of cecum, using an endo-GIA stapler.  The appendix was placed in a retrieval bag and removed through the subumbilical port incision.  There was some bleeding from the staple line that was controlled with hemoclips.    There was no evidence of leakage, or complication after division of the appendix. Copious irrigation was  performed and irrigant fluid suctioned from the abdomen as much as possible.  The abdominal cavity was inspected and there is no evidence of organ injury or bleeding.  The umbilical trocar was removed and the  port site fascia was closed via the purse string suture under laparoscopic vision. There was no residual palpable fascial defect.  The remaining trocars were removed and all  trocar site skin wounds were closed with 4-0 Monocryl subcuticular stitches. Sterile dressings were applied.  Instrument, sponge, and needle counts were correct at the conclusion of the Hagberg.   Findings: The appendix was found to be inflamed. There were not signs of necrosis.  There was not perforation. There was not abscess formation.  Estimated Blood Loss:  100 ml         Drains: none         Specimens: appendix         Complications:  None; patient tolerated the procedure well.         Disposition: PACU - hemodynamically stable.         Condition: stable

## 2013-08-19 NOTE — H&P (Signed)
Patient seen and examined.  Husband in the room.  Plan laparoscopic, possible open appendectomy.  I have discussed the procedure and risks of appendectomy. The risks include but are not limited to bleeding, infection, wound problems, anesthesia, injury to intra-abdominal organs, need for reoperation. She seems to understand and agrees with the plan.

## 2013-08-19 NOTE — ED Provider Notes (Signed)
CSN: [id number]     Arrival date & time 08/19/13  2595 History   First MD Initiated Contact with Patient 08/19/13 0532     Chief Complaint  Patient presents with  . Abdominal Pain     (Consider location/radiation/quality/duration/timing/severity/associated sxs/prior Treatment) HPI Patient presents with gradual onset right lower quadrant pain. She states the pain started last night waking her up from sleep multiple times. Gradually worsened associated with nausea and chills. She denies having pain similar to this in the past. Her last bowel movement was yesterday and was normal. She denies any urinary symptoms including hematuria or dysuria. She's had a hysterectomy in the past denies any vaginal bleeding or discharge. Past Medical History  Diagnosis Date  . Hypertension   . GERD (gastroesophageal reflux disease)   . Hiatal hernia   . Anxiety   . Migraines   . Depression   . Fibroids   . Basal cell carcinoma   . Osteopenia   . Esophageal stricture   . PONV (postoperative nausea and vomiting)    Past Surgical History  Procedure Laterality Date  . Partial hysterectomy    . Esophageal dilation     Family History  Problem Relation Age of Onset  . Diabetes    . Stroke Mother   . Hyperlipidemia     History  Substance Use Topics  . Smoking status: Never Smoker   . Smokeless tobacco: Never Used  . Alcohol Use: No   OB History   Grav Para Term Preterm Abortions TAB SAB Ect Mult Living                 Review of Systems  Constitutional: Positive for chills. Negative for fever and diaphoresis.  Respiratory: Negative for cough and shortness of breath.   Cardiovascular: Negative for chest pain, palpitations and leg swelling.  Gastrointestinal: Positive for nausea, vomiting and abdominal pain. Negative for diarrhea, constipation and blood in stool.  Genitourinary: Positive for difficulty urinating. Negative for dysuria, frequency, hematuria, flank pain, vaginal bleeding, vaginal  discharge and pelvic pain.  Musculoskeletal: Negative for back pain, myalgias, neck pain and neck stiffness.  Skin: Negative for rash and wound.  Neurological: Negative for dizziness, weakness, light-headedness, numbness and headaches.  All other systems reviewed and are negative.     Allergies  Augmentin and Lipitor  Home Medications   Prior to Admission medications   Medication Sig Start Date End Date Taking? Authorizing Provider  acetaminophen (TYLENOL) 500 MG tablet Take 500 mg by mouth as needed.   Yes Historical Provider, MD  fenofibrate 54 MG tablet Take 1 tablet by mouth daily. 08/05/13  Yes Historical Provider, MD  lisinopril-hydrochlorothiazide (PRINZIDE,ZESTORETIC) 10-12.5 MG per tablet Take 1 tablet by mouth daily.   Yes Historical Provider, MD  Loperamide HCl (IMODIUM PO) Take 1 tablet by mouth 2 (two) times daily.   Yes Historical Provider, MD  loratadine (CLARITIN) 10 MG tablet Take 10 mg by mouth daily.   Yes Historical Provider, MD  naproxen sodium (ANAPROX) 220 MG tablet Take 440 mg by mouth 2 (two) times daily as needed (pain).   Yes Historical Provider, MD  omeprazole (PRILOSEC) 20 MG capsule Take 20 mg by mouth daily.   Yes Historical Provider, MD  potassium chloride (K-DUR,KLOR-CON) 10 MEQ tablet Take 1 tablet by mouth 2 (two) times daily. 07/20/13  Yes Historical Provider, MD  simvastatin (ZOCOR) 20 MG tablet Take 1 tablet by mouth daily. 08/18/13  Yes Historical Provider, MD  venlafaxine XR (EFFEXOR-XR) 37.5  MG 24 hr capsule Take 37.5 mg by mouth daily.   Yes Historical Provider, MD  zolpidem (AMBIEN) 5 MG tablet Take 2.5 mg by mouth at bedtime as needed. For sleep   Yes Historical Provider, MD   BP 101/59  Pulse 59  Temp(Src) 97.6 F (36.4 C) (Oral)  Resp 16  SpO2 95% Physical Exam  Nursing note and vitals reviewed. Constitutional: She is oriented to person, place, and time. She appears well-developed and well-nourished. No distress.  HENT:  Head:  Normocephalic and atraumatic.  Mouth/Throat: Oropharynx is clear and moist. No oropharyngeal exudate.  Eyes: EOM are normal. Pupils are equal, round, and reactive to light.  Neck: Normal range of motion. Neck supple.  Cardiovascular: Normal rate and regular rhythm.   Pulmonary/Chest: Effort normal and breath sounds normal. No respiratory distress. She has no wheezes. She has no rales.  Abdominal: Soft. Bowel sounds are normal. She exhibits no distension and no mass. There is tenderness (right lower quadrant tenderness to to palpation with guarding.). There is guarding. There is no rebound.  Negative Rovsing sign  Musculoskeletal: Normal range of motion. She exhibits no edema and no tenderness.  No CVA tenderness bilaterally.  Neurological: She is alert and oriented to person, place, and time.  Moves all extremities without deficit. Sensation is grossly intact. Ambulatory in the emergency department.  Skin: Skin is warm and dry. No rash noted. No erythema.  Psychiatric: She has a normal mood and affect. Her behavior is normal.    ED Course  Procedures (including critical care time) Labs Review Labs Reviewed  CBC WITH DIFFERENTIAL - Abnormal; Notable for the following:    WBC 14.4 (*)    Neutrophils Relative % 81 (*)    Neutro Abs 11.8 (*)    All other components within normal limits  COMPREHENSIVE METABOLIC PANEL - Abnormal; Notable for the following:    Potassium 3.5 (*)    Glucose, Bld 104 (*)    GFR calc non Af Amer 57 (*)    GFR calc Af Amer 66 (*)    All other components within normal limits  SURGICAL PCR SCREEN  URINALYSIS, ROUTINE W REFLEX MICROSCOPIC  LIPASE, BLOOD  PROTIME-INR  APTT  I-STAT TROPOININ, ED    Imaging Review Ct Abdomen Pelvis W Contrast  08/19/2013   CLINICAL DATA:  Right lower quadrant pain, now worsening with nausea. History of hernia repair and hysterectomy.  EXAM: CT ABDOMEN AND PELVIS WITH CONTRAST  TECHNIQUE: Multidetector CT imaging of the abdomen  and pelvis was performed using the standard protocol following bolus administration of intravenous contrast.  CONTRAST:  40mL OMNIPAQUE IOHEXOL 300 MG/ML SOLN, 148mL OMNIPAQUE IOHEXOL 300 MG/ML SOLN  COMPARISON:  None.  FINDINGS: LUNG BASES: Included view of the lung bases are clear. Visualized heart and pericardium are unremarkable.  SOLID ORGANS: The liver, spleen, gallbladder, pancreas and adrenal glands are unremarkable.  GASTROINTESTINAL TRACT: The stomach, small and large bowel are normal in course and caliber without inflammatory changes. The appendix is a large, 11 mm with trace periappendiceal inflammation, 4 mm faint appendicolith. Sigmoid diverticulosis.  KIDNEYS/ URINARY TRACT: Kidneys are orthotopic, demonstrating symmetric enhancement. No nephrolithiasis, hydronephrosis or solid renal masses. 12 mm left interpolar cyst with additional too small to characterize hypodensities. The unopacified ureters are normal in course and caliber. Delayed imaging through the kidneys demonstrates symmetric prompt contrast excretion within the proximal urinary collecting system. Urinary bladder is partially distended and unremarkable.  PERITONEUM/RETROPERITONEUM: No intraperitoneal free fluid nor free air.  Aortoiliac vessels are normal in course and caliber, mild intimal thickening and calcific atherosclerosis. No lymphadenopathy by CT size criteria. Status post hysterectomy. Punctate calcifications in the left ovary.  SOFT TISSUE/OSSEOUS STRUCTURES: Nonsuspicious. Osteopenia. Subcentimeter sclerotic focus in left iliac bone likely reflects bone island.  IMPRESSION: Acute appendicitis without bowel perforation, abscess nor obstruction.  Diverticulosis without CT findings of acute diverticulitis.  Acute findings discussed with and reconfirmed by Dr.Joyce Heitman on8/19/2015at6:58 am.   Electronically Signed   By: Elon Alas   On: 08/19/2013 07:00   Dg Chest Port 1 View  08/19/2013   CLINICAL DATA:   Preoperative chest x-ray  EXAM: PORTABLE CHEST - 1 VIEW  COMPARISON:  Prior chest x-ray 09/13/2011  FINDINGS: Cardiac and mediastinal contours remain within normal limits. No pleural effusion, pneumothorax or evidence of pulmonary edema. Mild central bronchitic changes and interstitial prominence are similar compared to prior. No focal airspace consolidation or suspicious pulmonary nodule. No acute osseous abnormality.  IMPRESSION: Stable chest x-ray without evidence of acute cardiopulmonary process.  Background changes suggest COPD.   Electronically Signed   By: Jacqulynn Cadet M.D.   On: 08/19/2013 08:03     EKG Interpretation   Date/Time:  Wednesday August 19 2013 05:45:25 EDT Ventricular Rate:  75 PR Interval:  155 QRS Duration: 72 QT Interval:  408 QTC Calculation: 456 R Axis:   49 Text Interpretation:  Sinus or ectopic atrial rhythm Low voltage,  extremity and precordial leads Baseline wander in lead(s) V6 Sinus rhythm  Artifact Abnormal ekg Confirmed by Carmin Muskrat  MD ([id number]) on  08/19/2013 7:37:42 AM      MDM   Final diagnoses:  Acute appendicitis with localized peritonitis   Discussed the surgical team who will see the patient in the emergency department.     Julianne Rice, MD 08/19/13 [phone]

## 2013-08-19 NOTE — Anesthesia Postprocedure Evaluation (Signed)
  Anesthesia Post-op Note  Patient: Megan Mason  Procedure(s) Performed: Procedure(s): APPENDECTOMY LAPAROSCOPIC (N/A)  Patient Location: PACU  Anesthesia Type:General  Level of Consciousness: awake, alert  and oriented  Airway and Oxygen Therapy: Patient Spontanous Breathing  Post-op Pain: none  Post-op Assessment: Post-op Vital signs reviewed  Post-op Vital Signs: Reviewed  Last Vitals:  Filed Vitals:   08/19/13 1445  BP: 115/63  Pulse: 83  Temp: 37.2 C  Resp: 20    Complications: No apparent anesthesia complications

## 2013-08-19 NOTE — Consult Note (Deleted)
[facility] Admission Note  Megan Mason November 08, 1945  [id number].    Requesting MD: Dr. Lita Mains Chief Complaint/Reason for Consult: Acute appendicitis  HPI:  68 y/o white female with PMH HTN, GERD, hiatal hernia, esophageal stricture presents to [facility] with gradual onset generalized then right lower quadrant pain. She states the pain started last night, waking her up from sleep multiple times.  Gradually worsened to become severe pain (8/10) associated with nausea,chills, and feeling feverish. She denies having pain similar to this in the past. Her last bowel movement was yesterday and was normal. She denies any urinary symptoms including hematuria or dysuria.  Denies CP/SOB, changes in bowel/bladder function, objective fevers, vomiting or diarrhea.  No radiating pain, no precipitating or alleviating factors.  She's had a hysterectomy in the past for fibroids.  No h/o hernias.   ROS: All systems reviewed and otherwise negative except for as above  Family History  Problem Relation Age of Onset  . Diabetes    . Stroke Mother   . Hyperlipidemia      Past Medical History  Diagnosis Date  . Hypertension   . GERD (gastroesophageal reflux disease)   . Hiatal hernia   . Anxiety   . Migraines   . Depression   . Fibroids   . Basal cell carcinoma   . Osteopenia   . Esophageal stricture     Past Surgical History  Procedure Laterality Date  . Partial hysterectomy    . Esophageal dilation      Social History:  reports that she has never smoked. She has never used smokeless tobacco. She reports that she does not drink alcohol or use illicit drugs.  Allergies:  Allergies  Allergen Reactions  . Augmentin [Amoxicillin-Pot Clavulanate] Other (See Comments)    Made me feel funny  . Lipitor [Atorvastatin] Other (See Comments)    Made me feel funny     (Not in a hospital admission)  Blood pressure 105/44, pulse 78, temperature 98.2 F (36.8 C), temperature source  Oral, resp. rate 20, SpO2 97.00%. Physical Exam: General: pleasant, WD/WN white female who is laying in bed in NAD HEENT: head is normocephalic, atraumatic.  Sclera are noninjected.  PERRL.  Ears and nose without any masses or lesions.  Mouth is pink and moist Heart: regular, rate, and rhythm.  No obvious murmurs, gallops, or rubs noted.  Palpable pedal pulses bilaterally Lungs: CTAB, no wheezes, rhonchi, or rales noted.  Respiratory effort nonlabored Abd: soft, distended, tender at Mcburney's point, +BS, no masses, hernias, or organomegaly, low horizontal scar well healed MS: all 4 extremities are symmetrical with no cyanosis, clubbing, or edema. Skin: warm and dry with no masses, lesions, or rashes Psych: A&Ox3 with an appropriate affect.   Results for orders placed during the hospital encounter of 08/19/13 (from the past 48 hour(s))  CBC WITH DIFFERENTIAL     Status: Abnormal   Collection Time    08/19/13  5:44 AM      Result Value Ref Range   WBC 14.4 (*) 4.0 - 10.5 K/uL   RBC 4.58  3.87 - 5.11 MIL/uL   Hemoglobin 13.0  12.0 - 15.0 g/dL   HCT 40.5  36.0 - 46.0 %   MCV 88.4  78.0 - 100.0 fL   MCH 28.4  26.0 - 34.0 pg   MCHC 32.1  30.0 - 36.0 g/dL   RDW 12.6  11.5 - 15.5 %   Platelets 251  150 - 400 K/uL   Neutrophils  Relative % 81 (*) 43 - 77 %   Neutro Abs 11.8 (*) 1.7 - 7.7 K/uL   Lymphocytes Relative 12  12 - 46 %   Lymphs Abs 1.7  0.7 - 4.0 K/uL   Monocytes Relative 6  3 - 12 %   Monocytes Absolute 0.8  0.1 - 1.0 K/uL   Eosinophils Relative 1  0 - 5 %   Eosinophils Absolute 0.2  0.0 - 0.7 K/uL   Basophils Relative 0  0 - 1 %   Basophils Absolute 0.0  0.0 - 0.1 K/uL  COMPREHENSIVE METABOLIC PANEL     Status: Abnormal   Collection Time    08/19/13  5:44 AM      Result Value Ref Range   Sodium 144  137 - 147 mEq/L   Potassium 3.5 (*) 3.7 - 5.3 mEq/L   Chloride 103  96 - 112 mEq/L   CO2 27  19 - 32 mEq/L   Glucose, Bld 104 (*) 70 - 99 mg/dL   BUN 16  6 - 23 mg/dL    Creatinine, Ser 0.99  0.50 - 1.10 mg/dL   Calcium 9.9  8.4 - 10.5 mg/dL   Total Protein 8.1  6.0 - 8.3 g/dL   Albumin 4.5  3.5 - 5.2 g/dL   AST 36  0 - 37 U/L   ALT 31  0 - 35 U/L   Alkaline Phosphatase 78  39 - 117 U/L   Total Bilirubin 0.6  0.3 - 1.2 mg/dL   GFR calc non Af Amer 57 (*) >90 mL/min   GFR calc Af Amer 66 (*) >90 mL/min   Comment: (NOTE)     The eGFR has been calculated using the CKD EPI equation.     This calculation has not been validated in all clinical situations.     eGFR's persistently <90 mL/min signify possible Chronic Kidney     Disease.   Anion gap 14  5 - 15  LIPASE, BLOOD     Status: None   Collection Time    08/19/13  5:44 AM      Result Value Ref Range   Lipase 29  11 - 59 U/L  URINALYSIS, ROUTINE W REFLEX MICROSCOPIC     Status: None   Collection Time    08/19/13  5:48 AM      Result Value Ref Range   Color, Urine YELLOW  YELLOW   APPearance CLEAR  CLEAR   Specific Gravity, Urine 1.016  1.005 - 1.030   pH 8.0  5.0 - 8.0   Glucose, UA NEGATIVE  NEGATIVE mg/dL   Hgb urine dipstick NEGATIVE  NEGATIVE   Bilirubin Urine NEGATIVE  NEGATIVE   Ketones, ur NEGATIVE  NEGATIVE mg/dL   Protein, ur NEGATIVE  NEGATIVE mg/dL   Urobilinogen, UA 1.0  0.0 - 1.0 mg/dL   Nitrite NEGATIVE  NEGATIVE   Leukocytes, UA NEGATIVE  NEGATIVE   Comment: MICROSCOPIC NOT DONE ON URINES WITH NEGATIVE PROTEIN, BLOOD, LEUKOCYTES, NITRITE, OR GLUCOSE <1000 mg/dL.   Ct Abdomen Pelvis W Contrast  08/19/2013   CLINICAL DATA:  Right lower quadrant pain, now worsening with nausea. History of hernia repair and hysterectomy.  EXAM: CT ABDOMEN AND PELVIS WITH CONTRAST  TECHNIQUE: Multidetector CT imaging of the abdomen and pelvis was performed using the standard protocol following bolus administration of intravenous contrast.  CONTRAST:  76m OMNIPAQUE IOHEXOL 300 MG/ML SOLN, 1056mOMNIPAQUE IOHEXOL 300 MG/ML SOLN  COMPARISON:  None.  FINDINGS: LUNG  BASES: Included view of the lung bases  are clear. Visualized heart and pericardium are unremarkable.  SOLID ORGANS: The liver, spleen, gallbladder, pancreas and adrenal glands are unremarkable.  GASTROINTESTINAL TRACT: The stomach, small and large bowel are normal in course and caliber without inflammatory changes. The appendix is a large, 11 mm with trace periappendiceal inflammation, 4 mm faint appendicolith. Sigmoid diverticulosis.  KIDNEYS/ URINARY TRACT: Kidneys are orthotopic, demonstrating symmetric enhancement. No nephrolithiasis, hydronephrosis or solid renal masses. 12 mm left interpolar cyst with additional too small to characterize hypodensities. The unopacified ureters are normal in course and caliber. Delayed imaging through the kidneys demonstrates symmetric prompt contrast excretion within the proximal urinary collecting system. Urinary bladder is partially distended and unremarkable.  PERITONEUM/RETROPERITONEUM: No intraperitoneal free fluid nor free air. Aortoiliac vessels are normal in course and caliber, mild intimal thickening and calcific atherosclerosis. No lymphadenopathy by CT size criteria. Status post hysterectomy. Punctate calcifications in the left ovary.  SOFT TISSUE/OSSEOUS STRUCTURES: Nonsuspicious. Osteopenia. Subcentimeter sclerotic focus in left iliac bone likely reflects bone island.  IMPRESSION: Acute appendicitis without bowel perforation, abscess nor obstruction.  Diverticulosis without CT findings of acute diverticulitis.  Acute findings discussed with and reconfirmed by Dr.DAVID YELVERTON on8/19/2015at6:58 am.   Electronically Signed   By: Elon Alas   On: 08/19/2013 07:00      Assessment/Plan Acute appendicitis without perforation or abscess Leukocytosis  Plan: 1.  Admit to [facility] for urgent lap appy today 2.  NPO, bowel rest, IVF, pain control, antiemetics, antibiotics (Cefoxitin) 3.  SCD's  4.  Ambulate and IS 5.  Discussed risks/benefits to surgical intervention including bleeding, infection,  conversion to open, stump leak, and injury to other abdominal structures.  She wishes to proceed with surgery. 6.  Discussed some post-op course/instructions   Coralie Keens, [facility] Surgery 08/19/2013, 7:59 AM Pager: [phone]

## 2013-08-19 NOTE — ED Notes (Signed)
Pt states she has pain in her right lower quadrant that started earlier last night as a dull ache  Pt states this morning the pain is worse  Pt states she has nausea without vomiting

## 2013-08-19 NOTE — H&P (Signed)
[facility] Admission Note  Megan Mason 1945/07/27  [id number].    Requesting MD: Dr. Lita Mains Chief Complaint/Reason for Consult: Acute appendicitis  HPI:  68 y/o white female with PMH HTN, GERD, hiatal hernia, esophageal stricture presents to [facility] with gradual onset generalized then right lower quadrant pain. She states the pain started last night, waking her up from sleep multiple times.  Gradually worsened to become severe pain (8/10) associated with nausea,chills, and feeling feverish. She denies having pain similar to this in the past. Her last bowel movement was yesterday and was normal. She denies any urinary symptoms including hematuria or dysuria.  Denies CP/SOB, changes in bowel/bladder function, objective fevers, vomiting or diarrhea.  No radiating pain, no precipitating or alleviating factors.  She's had a hysterectomy in the past for fibroids.  No h/o hernias.   ROS: All systems reviewed and otherwise negative except for as above  Family History  Problem Relation Age of Onset  . Diabetes    . Stroke Mother   . Hyperlipidemia      Past Medical History  Diagnosis Date  . Hypertension   . GERD (gastroesophageal reflux disease)   . Hiatal hernia   . Anxiety   . Migraines   . Depression   . Fibroids   . Basal cell carcinoma   . Osteopenia   . Esophageal stricture     Past Surgical History  Procedure Laterality Date  . Partial hysterectomy    . Esophageal dilation      Social History:  reports that she has never smoked. She has never used smokeless tobacco. She reports that she does not drink alcohol or use illicit drugs.  Allergies:  Allergies  Allergen Reactions  . Augmentin [Amoxicillin-Pot Clavulanate] Other (See Comments)    Made me feel funny  . Lipitor [Atorvastatin] Other (See Comments)    Made me feel funny     (Not in a hospital admission)  Blood pressure 105/44, pulse 78, temperature 98.2 F (36.8 C), temperature source  Oral, resp. rate 20, SpO2 97.00%. Physical Exam: General: pleasant, WD/WN white female who is laying in bed in NAD HEENT: head is normocephalic, atraumatic.  Sclera are noninjected.  PERRL.  Ears and nose without any masses or lesions.  Mouth is pink and moist Heart: regular, rate, and rhythm.  No obvious murmurs, gallops, or rubs noted.  Palpable pedal pulses bilaterally Lungs: CTAB, no wheezes, rhonchi, or rales noted.  Respiratory effort nonlabored Abd: soft, distended, tender at Mcburney's point, +BS, no masses, hernias, or organomegaly, low horizontal scar well healed MS: all 4 extremities are symmetrical with no cyanosis, clubbing, or edema. Skin: warm and dry with no masses, lesions, or rashes Psych: A&Ox3 with an appropriate affect.   Results for orders placed during the hospital encounter of 08/19/13 (from the past 48 hour(s))  CBC WITH DIFFERENTIAL     Status: Abnormal   Collection Time    08/19/13  5:44 AM      Result Value Ref Range   WBC 14.4 (*) 4.0 - 10.5 K/uL   RBC 4.58  3.87 - 5.11 MIL/uL   Hemoglobin 13.0  12.0 - 15.0 g/dL   HCT 40.5  36.0 - 46.0 %   MCV 88.4  78.0 - 100.0 fL   MCH 28.4  26.0 - 34.0 pg   MCHC 32.1  30.0 - 36.0 g/dL   RDW 12.6  11.5 - 15.5 %   Platelets 251  150 - 400 K/uL   Neutrophils  Relative % 81 (*) 43 - 77 %   Neutro Abs 11.8 (*) 1.7 - 7.7 K/uL   Lymphocytes Relative 12  12 - 46 %   Lymphs Abs 1.7  0.7 - 4.0 K/uL   Monocytes Relative 6  3 - 12 %   Monocytes Absolute 0.8  0.1 - 1.0 K/uL   Eosinophils Relative 1  0 - 5 %   Eosinophils Absolute 0.2  0.0 - 0.7 K/uL   Basophils Relative 0  0 - 1 %   Basophils Absolute 0.0  0.0 - 0.1 K/uL  COMPREHENSIVE METABOLIC PANEL     Status: Abnormal   Collection Time    08/19/13  5:44 AM      Result Value Ref Range   Sodium 144  137 - 147 mEq/L   Potassium 3.5 (*) 3.7 - 5.3 mEq/L   Chloride 103  96 - 112 mEq/L   CO2 27  19 - 32 mEq/L   Glucose, Bld 104 (*) 70 - 99 mg/dL   BUN 16  6 - 23 mg/dL    Creatinine, Ser 0.99  0.50 - 1.10 mg/dL   Calcium 9.9  8.4 - 10.5 mg/dL   Total Protein 8.1  6.0 - 8.3 g/dL   Albumin 4.5  3.5 - 5.2 g/dL   AST 36  0 - 37 U/L   ALT 31  0 - 35 U/L   Alkaline Phosphatase 78  39 - 117 U/L   Total Bilirubin 0.6  0.3 - 1.2 mg/dL   GFR calc non Af Amer 57 (*) >90 mL/min   GFR calc Af Amer 66 (*) >90 mL/min   Comment: (NOTE)     The eGFR has been calculated using the CKD EPI equation.     This calculation has not been validated in all clinical situations.     eGFR's persistently <90 mL/min signify possible Chronic Kidney     Disease.   Anion gap 14  5 - 15  LIPASE, BLOOD     Status: None   Collection Time    08/19/13  5:44 AM      Result Value Ref Range   Lipase 29  11 - 59 U/L  URINALYSIS, ROUTINE W REFLEX MICROSCOPIC     Status: None   Collection Time    08/19/13  5:48 AM      Result Value Ref Range   Color, Urine YELLOW  YELLOW   APPearance CLEAR  CLEAR   Specific Gravity, Urine 1.016  1.005 - 1.030   pH 8.0  5.0 - 8.0   Glucose, UA NEGATIVE  NEGATIVE mg/dL   Hgb urine dipstick NEGATIVE  NEGATIVE   Bilirubin Urine NEGATIVE  NEGATIVE   Ketones, ur NEGATIVE  NEGATIVE mg/dL   Protein, ur NEGATIVE  NEGATIVE mg/dL   Urobilinogen, UA 1.0  0.0 - 1.0 mg/dL   Nitrite NEGATIVE  NEGATIVE   Leukocytes, UA NEGATIVE  NEGATIVE   Comment: MICROSCOPIC NOT DONE ON URINES WITH NEGATIVE PROTEIN, BLOOD, LEUKOCYTES, NITRITE, OR GLUCOSE <1000 mg/dL.   Ct Abdomen Pelvis W Contrast  08/19/2013   CLINICAL DATA:  Right lower quadrant pain, now worsening with nausea. History of hernia repair and hysterectomy.  EXAM: CT ABDOMEN AND PELVIS WITH CONTRAST  TECHNIQUE: Multidetector CT imaging of the abdomen and pelvis was performed using the standard protocol following bolus administration of intravenous contrast.  CONTRAST:  37m OMNIPAQUE IOHEXOL 300 MG/ML SOLN, 1066mOMNIPAQUE IOHEXOL 300 MG/ML SOLN  COMPARISON:  None.  FINDINGS: LUNG  BASES: Included view of the lung bases  are clear. Visualized heart and pericardium are unremarkable.  SOLID ORGANS: The liver, spleen, gallbladder, pancreas and adrenal glands are unremarkable.  GASTROINTESTINAL TRACT: The stomach, small and large bowel are normal in course and caliber without inflammatory changes. The appendix is a large, 11 mm with trace periappendiceal inflammation, 4 mm faint appendicolith. Sigmoid diverticulosis.  KIDNEYS/ URINARY TRACT: Kidneys are orthotopic, demonstrating symmetric enhancement. No nephrolithiasis, hydronephrosis or solid renal masses. 12 mm left interpolar cyst with additional too small to characterize hypodensities. The unopacified ureters are normal in course and caliber. Delayed imaging through the kidneys demonstrates symmetric prompt contrast excretion within the proximal urinary collecting system. Urinary bladder is partially distended and unremarkable.  PERITONEUM/RETROPERITONEUM: No intraperitoneal free fluid nor free air. Aortoiliac vessels are normal in course and caliber, mild intimal thickening and calcific atherosclerosis. No lymphadenopathy by CT size criteria. Status post hysterectomy. Punctate calcifications in the left ovary.  SOFT TISSUE/OSSEOUS STRUCTURES: Nonsuspicious. Osteopenia. Subcentimeter sclerotic focus in left iliac bone likely reflects bone island.  IMPRESSION: Acute appendicitis without bowel perforation, abscess nor obstruction.  Diverticulosis without CT findings of acute diverticulitis.  Acute findings discussed with and reconfirmed by Dr.DAVID YELVERTON on8/19/2015at6:58 am.   Electronically Signed   By: Elon Alas   On: 08/19/2013 07:00      Assessment/Plan Acute appendicitis without perforation or abscess Leukocytosis  Plan: 1.  Admit to [facility] for urgent lap appy today 2.  NPO, bowel rest, IVF, pain control, antiemetics, antibiotics (Cefoxitin) 3.  SCD's  4.  Ambulate and IS 5.  Discussed risks/benefits to surgical intervention including bleeding, infection,  conversion to open, stump leak, and injury to other abdominal structures.  She wishes to proceed with surgery. 6.  Discussed some post-op course/instructions   Coralie Keens, [facility] 08/19/2013, 7:59 AM Pager: [phone]

## 2013-08-19 NOTE — Transfer of Care (Signed)
Immediate Anesthesia Transfer of Care Note  Patient: Megan Mason  Procedure(s) Performed: Procedure(s): APPENDECTOMY LAPAROSCOPIC (N/A)  Patient Location: PACU  Anesthesia Type:General  Level of Consciousness: awake, alert  and patient cooperative  Airway & Oxygen Therapy: Patient Spontanous Breathing and Patient connected to face mask oxygen  Post-op Assessment: Report given to PACU RN, Post -op Vital signs reviewed and stable and Patient moving all extremities X 4  Post vital signs: Reviewed and stable  Complications: No apparent anesthesia complications

## 2013-08-19 NOTE — Anesthesia Preprocedure Evaluation (Addendum)
Anesthesia Evaluation  Patient identified by MRN, date of birth, ID band Patient awake    Reviewed: Allergy & Precautions, H&P , NPO status , Patient's Chart, lab work & pertinent test results  Airway Mallampati: III TM Distance: >3 FB Neck ROM: Full    Dental  (+) Teeth Intact, Dental Advisory Given   Pulmonary neg pulmonary ROS,  breath sounds clear to auscultation        Cardiovascular hypertension, Rhythm:Regular Rate:Normal     Neuro/Psych  Headaches, PSYCHIATRIC DISORDERS Anxiety Depression    GI/Hepatic Neg liver ROS, hiatal hernia, GERD-  ,  Endo/Other  negative endocrine ROS  Renal/GU negative Renal ROS  negative genitourinary   Musculoskeletal negative musculoskeletal ROS (+)   Abdominal   Peds  Hematology negative hematology ROS (+)   Anesthesia Other Findings   Reproductive/Obstetrics negative OB ROS                         Anesthesia Physical Anesthesia Plan  ASA: II  Anesthesia Plan: General   Post-op Pain Management:    Induction: Intravenous  Airway Management Planned: Oral ETT  Additional Equipment: None  Intra-op Plan:   Post-operative Plan: Extubation in OR  Informed Consent: I have reviewed the patients History and Physical, chart, labs and discussed the procedure including the risks, benefits and alternatives for the proposed anesthesia with the patient or authorized representative who has indicated his/her understanding and acceptance.   Dental advisory given  Plan Discussed with: CRNA  Anesthesia Plan Comments:         Anesthesia Quick Evaluation

## 2013-08-20 ENCOUNTER — Encounter (HOSPITAL_COMMUNITY): Payer: Self-pay | Admitting: General Surgery

## 2013-08-20 MED ORDER — HYDROCODONE-ACETAMINOPHEN 5-325 MG PO TABS: 1.0000 | ORAL_TABLET | ORAL | Status: DC | PRN

## 2013-08-20 NOTE — Progress Notes (Signed)
Discharge instructions reviewed with patient, vital signs are stable, incisions are within normal limits, questions and concerns answered, will continue to monitor patient Neta Mends RN 08-20-2013 9:58am

## 2013-08-20 NOTE — Progress Notes (Signed)
UR completed 

## 2013-08-20 NOTE — Discharge Instructions (Signed)
[facility] ______[facility] LAPAROSCOPIC SURGERY: POST OP INSTRUCTIONS Always review your discharge instruction sheet given to you by the facility where your surgery was performed. IF YOU HAVE DISABILITY OR FAMILY LEAVE FORMS, YOU MUST BRING THEM TO THE OFFICE FOR PROCESSING.   DO NOT GIVE THEM TO YOUR DOCTOR.  1. A prescription for pain medication may be given to you upon discharge.  Take your pain medication as prescribed, if needed.  If narcotic pain medicine is not needed, then you may take acetaminophen (Tylenol) or ibuprofen (Advil) as needed. 2. Take your usually prescribed medications unless otherwise directed. 3. If you need a refill on your pain medication, please contact your pharmacy.  They will contact our office to request authorization. Prescriptions will not be filled after 5pm or on week-ends. 4. You should follow a lowfat diet.   Be sure to include lots of fluids daily. 5. Most patients will experience some swelling and bruising in the area of the incisions.  Ice packs will help.  Swelling and bruising can take several days to resolve.  6. It is common to experience some constipation if taking pain medication after surgery.  Increasing fluid intake and taking a stool softener (such as Colace) will usually help or prevent this problem from occurring.  A mild laxative (Milk of Magnesia or Miralax) should be taken according to package instructions if there are no bowel movements after 48 hours. 7. Unless discharge instructions indicate otherwise, you may remove your bandages 72 hours after surgery, and you may shower at that time.  You may have steri-strips (small skin tapes) in place directly over the incision.  These strips should be left on the skin.  If your surgeon used skin glue on the incision, you may shower in 24 hours.  The glue will flake off over the next 2-3 weeks.  Any sutures or staples will be removed at the office during your follow-up visit. 8. ACTIVITIES:   You may resume regular (light) daily activities beginning the next day--such as daily self-care, walking, climbing stairs--gradually increasing activities as tolerated.  You may have sexual intercourse when it is comfortable.  Refrain from any heavy lifting or straining until approved by your doctor-no lifting over 10 pounds for 2 weeks. a. You may drive when you are no longer taking prescription pain medication, you can comfortably wear a seatbelt, and you can safely maneuver your car and apply brakes. b. RETURN TO WORK:  __________________________________________________________ 9. You should see your doctor in the office for a follow-up appointment approximately 2-3 weeks after your surgery.  Make sure that you call for this appointment within a day or two after you arrive home to insure a convenient appointment time. 10. OTHER INSTRUCTIONS: __________________________________________________________________________________________________________________________ __________________________________________________________________________________________________________________________ WHEN TO CALL YOUR DOCTOR: 1. Fever over 101.0 2. Inability to urinate 3. Continued bleeding from incision. 4. Increased pain, redness, or drainage from the incision. 5. Increasing abdominal pain  The clinic staff is available to answer your questions during regular business hours.  Please dont hesitate to call and ask to speak to one of the nurses for clinical concerns.  If you have a medical emergency, go to the nearest emergency room or call 911.  A surgeon from [facility] is always on call at the hospital. [address] [place], [place], [address]  [address] ? [address], [place], [place]   [address] [phone] ? [phone] ? FAX [phone] Web site: [email or web address]

## 2013-08-20 NOTE — Progress Notes (Signed)
1 Day Post-Op  Subjective: Feels good.  Minimal discomfort in RLQ.  Objective: Vital signs in last 24 hours: Temp:  [97.5 F (36.4 C)-99.4 F (37.4 C)] 97.5 F (36.4 C) (08/20 0549) Pulse Rate:  [54-88] 54 (08/20 0549) Resp:  [12-20] 16 (08/20 0549) BP: (93-129)/(38-66) 98/59 mmHg (08/20 0549) SpO2:  [95 %-99 %] 95 % (08/20 0549) Last BM Date: 08/18/13  Intake/Output from previous day: 08/19 0701 - 08/20 0700 In: 3320.8 [P.O.:120; I.V.:3100.8; IV Piggyback:100] Out: 1300 [Urine:1300] Intake/Output this shift:    PE: General- In NAD Abdomen-soft, dressings dry  Lab Results:   Recent Labs  08/19/13 0544  WBC 14.4*  HGB 13.0  HCT 40.5  PLT 251   BMET  Recent Labs  08/19/13 0544  NA 144  K 3.5*  CL 103  CO2 27  GLUCOSE 104*  BUN 16  CREATININE 0.99  CALCIUM 9.9   PT/INR  Recent Labs  08/19/13 0752  LABPROT 13.1  INR 0.99   Comprehensive Metabolic Panel:    Component Value Date/Time   NA 144 08/19/2013 0544   NA 140 09/13/2011 1310   K 3.5* 08/19/2013 0544   K 3.4* 09/13/2011 1310   CL 103 08/19/2013 0544   CL 98 09/13/2011 1310   CO2 27 08/19/2013 0544   CO2 31 09/13/2011 1310   BUN 16 08/19/2013 0544   BUN 14 09/13/2011 1310   CREATININE 0.99 08/19/2013 0544   CREATININE 0.85 09/13/2011 1310   GLUCOSE 104* 08/19/2013 0544   GLUCOSE 102* 09/13/2011 1310   CALCIUM 9.9 08/19/2013 0544   CALCIUM 10.4 09/13/2011 1310   AST 36 08/19/2013 0544   AST 24 09/13/2011 1747   ALT 31 08/19/2013 0544   ALT 13 09/13/2011 1747   ALKPHOS 78 08/19/2013 0544   ALKPHOS 100 09/13/2011 1747   BILITOT 0.6 08/19/2013 0544   BILITOT 0.4 09/13/2011 1747   PROT 8.1 08/19/2013 0544   PROT 8.3 09/13/2011 1747   ALBUMIN 4.5 08/19/2013 0544   ALBUMIN 4.9 09/13/2011 1747     Studies/Results: Ct Abdomen Pelvis W Contrast  08/19/2013   CLINICAL DATA:  Right lower quadrant pain, now worsening with nausea. History of hernia repair and hysterectomy.  EXAM: CT ABDOMEN AND PELVIS WITH  CONTRAST  TECHNIQUE: Multidetector CT imaging of the abdomen and pelvis was performed using the standard protocol following bolus administration of intravenous contrast.  CONTRAST:  67mL OMNIPAQUE IOHEXOL 300 MG/ML SOLN, 152mL OMNIPAQUE IOHEXOL 300 MG/ML SOLN  COMPARISON:  None.  FINDINGS: LUNG BASES: Included view of the lung bases are clear. Visualized heart and pericardium are unremarkable.  SOLID ORGANS: The liver, spleen, gallbladder, pancreas and adrenal glands are unremarkable.  GASTROINTESTINAL TRACT: The stomach, small and large bowel are normal in course and caliber without inflammatory changes. The appendix is a large, 11 mm with trace periappendiceal inflammation, 4 mm faint appendicolith. Sigmoid diverticulosis.  KIDNEYS/ URINARY TRACT: Kidneys are orthotopic, demonstrating symmetric enhancement. No nephrolithiasis, hydronephrosis or solid renal masses. 12 mm left interpolar cyst with additional too small to characterize hypodensities. The unopacified ureters are normal in course and caliber. Delayed imaging through the kidneys demonstrates symmetric prompt contrast excretion within the proximal urinary collecting system. Urinary bladder is partially distended and unremarkable.  PERITONEUM/RETROPERITONEUM: No intraperitoneal free fluid nor free air. Aortoiliac vessels are normal in course and caliber, mild intimal thickening and calcific atherosclerosis. No lymphadenopathy by CT size criteria. Status post hysterectomy. Punctate calcifications in the left ovary.  SOFT TISSUE/OSSEOUS STRUCTURES: Nonsuspicious. Osteopenia.  Subcentimeter sclerotic focus in left iliac bone likely reflects bone island.  IMPRESSION: Acute appendicitis without bowel perforation, abscess nor obstruction.  Diverticulosis without CT findings of acute diverticulitis.  Acute findings discussed with and reconfirmed by Dr.DAVID YELVERTON on8/19/2015at6:58 am.   Electronically Signed   By: Elon Alas   On: 08/19/2013 07:00    Dg Chest Port 1 View  08/19/2013   CLINICAL DATA:  Preoperative chest x-ray  EXAM: PORTABLE CHEST - 1 VIEW  COMPARISON:  Prior chest x-ray 09/13/2011  FINDINGS: Cardiac and mediastinal contours remain within normal limits. No pleural effusion, pneumothorax or evidence of pulmonary edema. Mild central bronchitic changes and interstitial prominence are similar compared to prior. No focal airspace consolidation or suspicious pulmonary nodule. No acute osseous abnormality.  IMPRESSION: Stable chest x-ray without evidence of acute cardiopulmonary process.  Background changes suggest COPD.   Electronically Signed   By: Jacqulynn Cadet M.D.   On: 08/19/2013 08:03    Anti-infectives: Anti-infectives   Start     Dose/Rate Route Frequency Ordered Stop   08/19/13 1330  cefOXitin (MEFOXIN) 1 g in dextrose 5 % 50 mL IVPB     1 g 100 mL/hr over 30 Minutes Intravenous To Surgery 08/19/13 1315 08/19/13 1322   08/19/13 1300  cefOXitin (MEFOXIN) 2 g in dextrose 5 % 50 mL IVPB  Status:  Discontinued     2 g 100 mL/hr over 30 Minutes Intravenous To Surgery 08/19/13 1257 08/19/13 1315   08/19/13 0900  cefOXitin (MEFOXIN) 1 g in dextrose 5 % 50 mL IVPB     1 g 100 mL/hr over 30 Minutes Intravenous 4 times per day 08/19/13 0820        Assessment Principal Problem:   Appendicitis, acute-s/p laparoscopic appendectomy 8/19-doing well.     LOS: 1 day   Plan: Discharge.  Instructions given.   Cathlin Buchan J 08/20/2013

## 2013-08-25 NOTE — Discharge Summary (Signed)
Physician Discharge Summary  Patient ID: Megan Mason MRN: [id number] DOB/AGE: 70-Mar-1947 12 y.o.  Admit date: 08/19/2013 Discharge date: 08/20/2013  Admission Diagnoses:  Acute appendicitis  Discharge Diagnoses:  Principal Problem:   Appendicitis, acute Active Problems:   Acute appendicitis   Discharged Condition: good  Hospital Course: she was admitted and underwent a laparoscopic appendectomy. She tolerated this well. She was able to be discharged on postop day #1. Discharge instructions were given to her.  Consults: None  Significant Diagnostic Studies: none  Treatments: surgery: laparoscopic appendectomy  Discharge Exam: Blood pressure 98/59, pulse 54, temperature 97.5 F (36.4 C), temperature source Oral, resp. rate 16, height 5\' 4"  (1.626 m), weight 138 lb (62.596 kg), SpO2 95.00%.   Disposition: 01-Home or Self Care     Medication List         acetaminophen 500 MG tablet  Commonly known as:  TYLENOL  Take 500 mg by mouth as needed.     fenofibrate 54 MG tablet  Take 1 tablet by mouth daily.     HYDROcodone-acetaminophen 5-325 MG per tablet  Commonly known as:  NORCO/VICODIN  Take 1-2 tablets by mouth every 4 (four) hours as needed for moderate pain.     IMODIUM PO  Take 1 tablet by mouth 2 (two) times daily.     lisinopril-hydrochlorothiazide 10-12.5 MG per tablet  Commonly known as:  PRINZIDE,ZESTORETIC  Take 1 tablet by mouth daily.     loratadine 10 MG tablet  Commonly known as:  CLARITIN  Take 10 mg by mouth daily.     naproxen sodium 220 MG tablet  Commonly known as:  ANAPROX  Take 440 mg by mouth 2 (two) times daily as needed (pain).     omeprazole 20 MG capsule  Commonly known as:  PRILOSEC  Take 20 mg by mouth daily.     potassium chloride 10 MEQ tablet  Commonly known as:  K-DUR,KLOR-CON  Take 1 tablet by mouth 2 (two) times daily.     simvastatin 20 MG tablet  Commonly known as:  ZOCOR  Take 1 tablet by mouth daily.     venlafaxine XR 37.5 MG 24 hr capsule  Commonly known as:  EFFEXOR-XR  Take 37.5 mg by mouth daily.     zolpidem 5 MG tablet  Commonly known as:  AMBIEN  Take 2.5 mg by mouth at bedtime as needed. For sleep           Follow-up Information   Follow up with [facility] On 09/15/2013. (2:15pm, arrive by 1:45pm for paperwork)    Contact information:   [address]   [place] [address] [phone]       Signed: Odis Hollingshead 08/25/2013, 12:13 PM

## 2013-09-15 ENCOUNTER — Encounter (INDEPENDENT_AMBULATORY_CARE_PROVIDER_SITE_OTHER): Payer: Medicare Other

## 2014-06-28 ENCOUNTER — Other Ambulatory Visit: Payer: Self-pay

## 2015-01-10 ENCOUNTER — Emergency Department (HOSPITAL_COMMUNITY)
Admission: EM | Admit: 2015-01-10 | Discharge: 2015-01-10 | Disposition: A | Discharge status: Home / Self Care | Payer: Medicare Other | Attending: Emergency Medicine | Admitting: Emergency Medicine

## 2015-01-10 ENCOUNTER — Encounter (HOSPITAL_COMMUNITY): Payer: Self-pay | Admitting: Emergency Medicine

## 2015-01-10 ENCOUNTER — Emergency Department (HOSPITAL_COMMUNITY): Payer: Medicare Other

## 2015-01-10 DIAGNOSIS — Z79899 Other long term (current) drug therapy: Secondary | ICD-10-CM | POA: Diagnosis not present

## 2015-01-10 DIAGNOSIS — G43909 Migraine, unspecified, not intractable, without status migrainosus: Secondary | ICD-10-CM | POA: Insufficient documentation

## 2015-01-10 DIAGNOSIS — Y998 Other external cause status: Secondary | ICD-10-CM | POA: Diagnosis not present

## 2015-01-10 DIAGNOSIS — Z8739 Personal history of other diseases of the musculoskeletal system and connective tissue: Secondary | ICD-10-CM | POA: Insufficient documentation

## 2015-01-10 DIAGNOSIS — K219 Gastro-esophageal reflux disease without esophagitis: Secondary | ICD-10-CM | POA: Insufficient documentation

## 2015-01-10 DIAGNOSIS — F419 Anxiety disorder, unspecified: Secondary | ICD-10-CM | POA: Diagnosis not present

## 2015-01-10 DIAGNOSIS — Y92009 Unspecified place in unspecified non-institutional (private) residence as the place of occurrence of the external cause: Secondary | ICD-10-CM | POA: Diagnosis not present

## 2015-01-10 DIAGNOSIS — Z86018 Personal history of other benign neoplasm: Secondary | ICD-10-CM | POA: Diagnosis not present

## 2015-01-10 DIAGNOSIS — S42255A Nondisplaced fracture of greater tuberosity of left humerus, initial encounter for closed fracture: Secondary | ICD-10-CM | POA: Diagnosis not present

## 2015-01-10 DIAGNOSIS — Z85828 Personal history of other malignant neoplasm of skin: Secondary | ICD-10-CM | POA: Diagnosis not present

## 2015-01-10 DIAGNOSIS — W1839XA Other fall on same level, initial encounter: Secondary | ICD-10-CM | POA: Insufficient documentation

## 2015-01-10 DIAGNOSIS — Y9389 Activity, other specified: Secondary | ICD-10-CM | POA: Diagnosis not present

## 2015-01-10 DIAGNOSIS — I10 Essential (primary) hypertension: Secondary | ICD-10-CM | POA: Insufficient documentation

## 2015-01-10 DIAGNOSIS — S42202A Unspecified fracture of upper end of left humerus, initial encounter for closed fracture: Secondary | ICD-10-CM

## 2015-01-10 DIAGNOSIS — S4992XA Unspecified injury of left shoulder and upper arm, initial encounter: Secondary | ICD-10-CM | POA: Diagnosis present

## 2015-01-10 DIAGNOSIS — F329 Major depressive disorder, single episode, unspecified: Secondary | ICD-10-CM | POA: Insufficient documentation

## 2015-01-10 MED ORDER — MORPHINE SULFATE (PF) 4 MG/ML IV SOLN
4.0000 mg | Freq: Once | INTRAVENOUS | Status: AC
  Administered 2015-01-10: 4 mg via INTRAVENOUS
  Filled 2015-01-10: qty 1

## 2015-01-10 MED ORDER — ONDANSETRON HCL 4 MG/2ML IJ SOLN
4.0000 mg | Freq: Once | INTRAMUSCULAR | Status: AC
  Administered 2015-01-10: 4 mg via INTRAVENOUS
  Filled 2015-01-10: qty 2

## 2015-01-10 MED ORDER — HYDROCODONE-ACETAMINOPHEN 5-325 MG PO TABS: 1.0000 | ORAL_TABLET | Freq: Four times a day (QID) | ORAL | Status: DC | PRN

## 2015-01-10 NOTE — ED Provider Notes (Signed)
CSN: [id number]     Arrival date & time 01/10/15  0849 History   First MD Initiated Contact with Patient 01/10/15 [phone]     Chief Complaint  Patient presents with  . Shoulder Injury     (Consider location/radiation/quality/duration/timing/severity/associated sxs/prior Treatment) The history is provided by the patient.  Patient c/o left shoulder pain since fall at home this AM.  Pt indicates was trying to put on pants without sitting down, hopping on left, and fell onto left shoulder. Left shoulder pain constant, dull, severe, worse w movement. Right hand dominant. Denies any other pain or injury. No radicular pain. No numbness/weakness. No headache. No neck or back pain. Skin intact.       Past Medical History  Diagnosis Date  . Hypertension   . GERD (gastroesophageal reflux disease)   . Hiatal hernia   . Anxiety   . Migraines   . Depression   . Fibroids   . Basal cell carcinoma   . Osteopenia   . Esophageal stricture   . PONV (postoperative nausea and vomiting)    Past Surgical History  Procedure Laterality Date  . Partial hysterectomy    . Esophageal dilation    . Laparoscopic appendectomy N/A 08/19/2013    Procedure: APPENDECTOMY LAPAROSCOPIC;  Surgeon: Odis Hollingshead, MD;  Location: [facility];  Service: General;  Laterality: N/A;   Family History  Problem Relation Age of Onset  . Diabetes    . Stroke Mother   . Hyperlipidemia     Social History  Substance Use Topics  . Smoking status: Never Smoker   . Smokeless tobacco: Never Used  . Alcohol Use: No   OB History    No data available     Review of Systems  Constitutional: Negative for fever and chills.  HENT: Negative for sore throat.   Eyes: Negative for redness.  Respiratory: Negative for shortness of breath.   Cardiovascular: Negative for chest pain.  Gastrointestinal: Negative for vomiting and abdominal pain.  Genitourinary: Negative for flank pain.  Musculoskeletal: Negative for back pain and neck  pain.  Skin: Negative for rash.  Neurological: Negative for weakness, numbness and headaches.  Hematological: Does not bruise/bleed easily.  Psychiatric/Behavioral: Negative for confusion.      Allergies  Augmentin and Lipitor  Home Medications   Prior to Admission medications   Medication Sig Start Date End Date Taking? Authorizing Provider  acetaminophen (TYLENOL) 500 MG tablet Take 500 mg by mouth as needed.    Historical Provider, MD  fenofibrate 54 MG tablet Take 1 tablet by mouth daily. 08/05/13   Historical Provider, MD  HYDROcodone-acetaminophen (NORCO/VICODIN) 5-325 MG per tablet Take 1-2 tablets by mouth every 4 (four) hours as needed for moderate pain. 08/20/13   Jackolyn Confer, MD  lisinopril-hydrochlorothiazide (PRINZIDE,ZESTORETIC) 10-12.5 MG per tablet Take 1 tablet by mouth daily.    Historical Provider, MD  Loperamide HCl (IMODIUM PO) Take 1 tablet by mouth 2 (two) times daily.    Historical Provider, MD  loratadine (CLARITIN) 10 MG tablet Take 10 mg by mouth daily.    Historical Provider, MD  naproxen sodium (ANAPROX) 220 MG tablet Take 440 mg by mouth 2 (two) times daily as needed (pain).    Historical Provider, MD  omeprazole (PRILOSEC) 20 MG capsule Take 20 mg by mouth daily.    Historical Provider, MD  potassium chloride (K-DUR,KLOR-CON) 10 MEQ tablet Take 1 tablet by mouth 2 (two) times daily. 07/20/13   Historical Provider, MD  simvastatin ([place])  20 MG tablet Take 1 tablet by mouth daily. 08/18/13   Historical Provider, MD  venlafaxine XR (EFFEXOR-XR) 37.5 MG 24 hr capsule Take 37.5 mg by mouth daily.    Historical Provider, MD  zolpidem (AMBIEN) 5 MG tablet Take 2.5 mg by mouth at bedtime as needed. For sleep    Historical Provider, MD   BP 109/64 mmHg  Pulse 75  Temp(Src) 97.5 F (36.4 C) (Oral)  Resp 18  SpO2 100% Physical Exam  Constitutional: She is oriented to person, place, and time. She appears well-developed and well-nourished. No distress.  HENT:   Head: Atraumatic.  Mouth/Throat: Oropharynx is clear and moist.  Eyes: Conjunctivae are normal. No scleral icterus.  Neck: Neck supple. No tracheal deviation present.  Cardiovascular: Normal rate, normal heart sounds and intact distal pulses.   Pulmonary/Chest: Effort normal and breath sounds normal. No respiratory distress. She exhibits no tenderness.  Abdominal: Soft. Normal appearance and bowel sounds are normal. She exhibits no distension. There is no tenderness.  Musculoskeletal:  Left shoulder mild swelling, tenderness diffusely. Radial pulse 2+.  CTLS spine, non tender, aligned, no step off.   Neurological: She is alert and oriented to person, place, and time.  LUE, rad/med/uln nerve fxn, motor and sens, intact.   Skin: Skin is warm and dry. No rash noted.  Psychiatric: She has a normal mood and affect.  Nursing note and vitals reviewed.   ED Course  Procedures (including critical care time)   Dg Shoulder Left  01/10/2015  CLINICAL DATA:  [place] today.  Injured left shoulder. EXAM: LEFT SHOULDER - 2+ VIEW COMPARISON:  None. FINDINGS: There is a mildly impacted humeral neck fracture and a nondisplaced greater tuberosity fracture. No dislocation. The [facility] joint is intact. The visualized left lung is clear. No definite rib fractures. IMPRESSION: Mildly impacted humeral head and neck fracture. Electronically Signed   By: Marijo Sanes M.D.   On: 01/10/2015 09:23       I have personally reviewed and evaluated these images as part of my medical decision-making.    MDM   Iv ns. Morphine iv. zofran iv.   Xrays.  Reviewed nursing notes and prior charts for additional history.   Recheck pain improved.  Radial pulse 2+.  Shoulder immobilizer.  Discussed xrays w pt.   Morphine.  Pain controlled, and pt appears stable for d/c.     Lajean Saver, MD 01/10/15 [phone]

## 2015-01-10 NOTE — ED Notes (Signed)
Pt was getting dressed this morning and while putting on her pants fell onto the floor on her L shoulder. Pt c/o severe pain in L shoulder. Denies any other injury. Pt denies dizziness, lightheadedness. Pt A&Ox4. Pt sts "I can move my arm a little bit but it hurts A LOT." Pt holding L shoulder since arrival. Denies numbness and tingling.

## 2015-01-10 NOTE — Discharge Instructions (Signed)
It was our pleasure to provide your ER care today - we hope that you feel better.  Keep head/shoulder area elevated as much as possible to help with swelling and pain.  Wear shoulder immobilizer for comfort/support.  Ice/coldpacks to sore area.   Take motrin or aleve as need for pain. You may also take hydrocodone as need for pain. No driving when taking hydrocodone. Also, do not take tylenol or acetaminophen containing medication when taking hydrocodone.  Follow up with orthopedist in the coming week - call office to arrange appointment.  Return to ER if worse, new symptoms, new or intractable pain, numbness/weakness, other concern.  You were given pain medication in the ER - no driving for the next 4 hours.     Shoulder Fracture (Proximal Humerus or Glenoid) A shoulder fracture is a broken upper arm bone or a broken socket bone. The humerus is the upper arm bone and the glenoid is the shoulder socket. Proximal means the humerus is broken near the shoulder. Most of the time the bones of a broken shoulder are in an acceptable position. Usually, the injury can be treated with a shoulder immobilizer or sling and swath bandage. These devices support the arm and prevent any shoulder movement. If the bones are not in a good position, then surgery is sometimes needed. Shoulder fractures usually initially cause swelling, pain, and discoloration around the upper arm. They heal in 8 to 12 weeks with proper treatment. SYMPTOMS  At the time of injury:  Pain.  Tenderness.  Regular body contours are not normal. Later symptoms may include:  Swelling and bruising of the elbow and hand.  Swelling and bruising of the arm or chest. Other symptoms include:  Pain when lifting or turning the arm.  Paralysis below the fracture.  Numbness or coldness below the fracture. CAUSES   Indirect force from falling on an outstretched arm.  A blow to the shoulder. RISK INCREASES WITH:  Not being in  shape.  Playing contact sports, such as football, soccer, hockey, or rugby.  Sports where falling on an outstretched arm occurs, such as basketball, skateboarding, or volleyball.  History of bone or joint disease.  History of shoulder injury. PREVENTION  Warm up before activity.  Stretch before activity.  Stay in shape with your:  Heart fitness.  Flexibility.  Shoulder Strength.  Falling with the proper technique. PROGNOSIS  In adults, healing time is about 7 weeks. For children, healing time is about 5 weeks. Surgery may be needed. RELATED COMPLICATIONS  The bones do not heal together (nonunion).  The bones do not align properly when they heal (malunion).  Long-term problems with pain, stiffness, swelling, or loss of motion.  The injured arm heals shorter than the other.  Nerves are injured in the arm.  Arthritis in the shoulder.  Normal bone growth is interrupted in children.  Blood supply to the shoulder joint is diminished. TREATMENT If the bones are aligned, then initial treatment will be with ice and medicine to help with pain. The shoulder will be held in place with a sling (immobilization). The shoulder will be allowed to heal for up to 6 weeks. Injuries that may need surgery include:  Severe fractures.  Fractures that are not in appropriate alignment (displaced).  Non-displaced fractures (not common). Surgery helps the bones align correctly. The bones may be held in place with:  Sutures.  Wires.  Rods.  Plates.  Screws.  Pins. If you have had surgery or not, you will likely  be assisted by a physical therapist or athletic trainer to get the best results with your injured shoulder. This will likely include exercises to strengthen and stretch the injured and surrounding areas. MEDICATION  If pain medicine is needed, nonsteroidal anti-inflammatory medicines (such as aspirin or ibuprofen) or other minor pain relievers (such as acetaminophen) are  often advised.  Do not take pain medicine for 7 days before surgery.  Stronger pain relievers may be prescribed. Use only as directed and take only as much as you need. COLD THERAPY Cold treatment (icing) relieves pain and reduces inflammation. Cold treatment should be applied for 10 to 15 minutes every 2 to 3 hours, and immediately after activity that aggravates your symptoms. Use ice packs or an ice massage. SEEK IMMEDIATE MEDICAL CARE IF:  You have severe shoulder pain unrelieved by rest and taking pain medicine.  You have pain, numbness, tingling, or weakness in the hand or wrist.  You have shortness of breath, chest pain, severe weakness, or fainting.  You have severe pain with motion of the fingers or wrist.  Blue, gray, or dark color appears in the fingernails on injured extremity.   This information is not intended to replace advice given to you by your health care provider. Make sure you discuss any questions you have with your health care provider.   Document Released: 12/18/2004 Document Revised: 03/12/2011 Document Reviewed: 04/01/2008 Elsevier Interactive Patient Education 2016 [employer].  How to Use a Shoulder Immobilizer A shoulder immobilizer is a device that you may have to wear after a shoulder injury or surgery. This device keeps your arm from moving. This prevents additional pain or injury. It also supports your arm next to your body as your shoulder heals. You may need to wear a shoulder immobilizer to treat a broken bone (fracture) in your shoulder. You may also need to wear one if you have an injury that moves your shoulder out of position (dislocation). There are different types of shoulder immobilizers. The one that you get depends on your injury. RISKS AND COMPLICATIONS Wearing a shoulder immobilizer in the wrong way can let your injured shoulder move around too much. This may delay healing and make your pain and swelling worse. HOW TO USE YOUR SHOULDER  IMMOBILIZER  The part of the immobilizer that goes around your neck (sling) should support your upper arm, with your elbow bent and your lower arm and hand across your chest.  Make sure that your elbow:  Is snug against the back pocket of the sling.  Does not move away from your body.  The strap of the immobilizer should go over your shoulder and support your arm and hand. Your hand should be slightly higher than your elbow. It should not hang loosely over the edge of the sling.  If the long strap has a pad, place it where it is most comfortable on your neck.  Carefully follow your health care provider's instructions for wearing your shoulder immobilizer. Your health care provider may want you to:  Loosen your immobilizer to straighten your elbow and move your wrist and fingers. You may have to do this several times each day. Ask your health care provider when you should do this and how often.  Remove your immobilizer once every day to shower, but limit the movement in your injured arm. Before putting the immobilizer back on, use a towel to dry the area under your arm completely.  Remove your immobilizer to do shoulder exercises at home as  directed by your health care provider.  Wear your immobilizer while you sleep. You may sleep more comfortably if you have your upper body raised on pillows. SEEK MEDICAL CARE IF:  Your immobilizer is not supporting your arm properly.  Your immobilizer gets damaged.  You have worsening pain or swelling in your shoulder, arm, or hand.  Your shoulder, arm, or hand changes color or temperature.  You lose feeling in your shoulder, arm, or hand.   This information is not intended to replace advice given to you by your health care provider. Make sure you discuss any questions you have with your health care provider.   Document Released: 01/26/2004 Document Revised: 05/04/2014 Document Reviewed: 11/25/2013 Elsevier Interactive Patient Education 2016  [place].    Cryotherapy Cryotherapy means treatment with cold. Ice or gel packs can be used to reduce both pain and swelling. Ice is the most helpful within the first 24 to 48 hours after an injury or flare-up from overusing a muscle or joint. Sprains, strains, spasms, burning pain, shooting pain, and aches can all be eased with ice. Ice can also be used when recovering from surgery. Ice is effective, has very few side effects, and is safe for most people to use. PRECAUTIONS  Ice is not a safe treatment option for people with:  Raynaud phenomenon. This is a condition affecting small blood vessels in the extremities. Exposure to cold may cause your problems to return.  Cold hypersensitivity. There are many forms of cold hypersensitivity, including:  Cold urticaria. Red, itchy hives appear on the skin when the tissues begin to warm after being iced.  Cold erythema. This is a red, itchy rash caused by exposure to cold.  Cold hemoglobinuria. Red blood cells break down when the tissues begin to warm after being iced. The hemoglobin that carry oxygen are passed into the urine because they cannot combine with blood proteins fast enough.  Numbness or altered sensitivity in the area being iced. If you have any of the following conditions, do not use ice until you have discussed cryotherapy with your caregiver:  Heart conditions, such as arrhythmia, angina, or chronic heart disease.  High blood pressure.  Healing wounds or open skin in the area being iced.  Current infections.  Rheumatoid arthritis.  Poor circulation.  Diabetes. Ice slows the blood flow in the region it is applied. This is beneficial when trying to stop inflamed tissues from spreading irritating chemicals to surrounding tissues. However, if you expose your skin to cold temperatures for too long or without the proper protection, you can damage your skin or nerves. Watch for signs of skin damage due to cold. HOME CARE  INSTRUCTIONS Follow these tips to use ice and cold packs safely.  Place a dry or damp towel between the ice and skin. A damp towel will cool the skin more quickly, so you may need to shorten the time that the ice is used.  For a more rapid response, add gentle compression to the ice.  Ice for no more than 10 to 20 minutes at a time. The bonier the area you are icing, the less time it will take to get the benefits of ice.  Check your skin after 5 minutes to make sure there are no signs of a poor response to cold or skin damage.  Rest 20 minutes or more between uses.  Once your skin is numb, you can end your treatment. You can test numbness by very lightly touching your skin. The  touch should be so light that you do not see the skin dimple from the pressure of your fingertip. When using ice, most people will feel these normal sensations in this order: cold, burning, aching, and numbness.  Do not use ice on someone who cannot communicate their responses to pain, such as small children or people with dementia. HOW TO MAKE AN ICE PACK Ice packs are the most common way to use ice therapy. Other methods include ice massage, ice baths, and cryosprays. Muscle creams that cause a cold, tingly feeling do not offer the same benefits that ice offers and should not be used as a substitute unless recommended by your caregiver. To make an ice pack, do one of the following:  Place crushed ice or a bag of frozen vegetables in a sealable plastic bag. Squeeze out the excess air. Place this bag inside another plastic bag. Slide the bag into a pillowcase or place a damp towel between your skin and the bag.  Mix 3 parts water with 1 part rubbing alcohol. Freeze the mixture in a sealable plastic bag. When you remove the mixture from the freezer, it will be slushy. Squeeze out the excess air. Place this bag inside another plastic bag. Slide the bag into a pillowcase or place a damp towel between your skin and the  bag. SEEK MEDICAL CARE IF:  You develop white spots on your skin. This may give the skin a blotchy (mottled) appearance.  Your skin turns blue or pale.  Your skin becomes waxy or hard.  Your swelling gets worse. MAKE SURE YOU:   Understand these instructions.  Will watch your condition.  Will get help right away if you are not doing well or get worse.   This information is not intended to replace advice given to you by your health care provider. Make sure you discuss any questions you have with your health care provider.   Document Released: 08/14/2010 Document Revised: 01/08/2014 Document Reviewed: 08/14/2010 Elsevier Interactive Patient Education [employer].

## 2015-01-10 NOTE — ED Notes (Signed)
Ortho called 

## 2016-01-30 ENCOUNTER — Encounter: Payer: Self-pay | Admitting: Internal Medicine

## 2016-01-30 ENCOUNTER — Ambulatory Visit (INDEPENDENT_AMBULATORY_CARE_PROVIDER_SITE_OTHER): Payer: PPO | Admitting: Internal Medicine

## 2016-01-30 VITALS — BP 100/58 | HR 76 | Ht 65.0 in | Wt 137.2 lb

## 2016-01-30 DIAGNOSIS — K219 Gastro-esophageal reflux disease without esophagitis: Secondary | ICD-10-CM | POA: Diagnosis not present

## 2016-01-30 DIAGNOSIS — K222 Esophageal obstruction: Secondary | ICD-10-CM

## 2016-01-30 DIAGNOSIS — R1310 Dysphagia, unspecified: Secondary | ICD-10-CM

## 2016-01-30 DIAGNOSIS — R1319 Other dysphagia: Secondary | ICD-10-CM

## 2016-01-30 NOTE — Progress Notes (Signed)
HISTORY OF PRESENT ILLNESS:  Megan Mason is a 71 y.o. female with hypertension, anxiety, depression, migraine headaches, IBS, and GERD, complicated by peptic stricture for which she has undergone prior esophageal dilation. The patient was last evaluated in this office February 2014 regarding disorder of taste not felt to be GI in origin, chronic GERD, and IBS. See that dictation. Patient presents today after she was advised to stop taking PPI for GERD over concerns of potential long-term side effects unspecified. She was placed on H2 receptor antagonist therapy as a substitute. Off PPI and on H2 receptor antagonist therapy the patient reports severe problems with heartburn, indigestion, chest pain, and recurrence of some dysphagia. She feels miserable. She was encouraged by her primary care 5 to follow-up with GI regarding management of her GERD and a dressing potential PPI related issues. The patient's last upper endoscopy with esophageal dilation to 18 mm was February 2002. Her last colonoscopy was July 2008 with sigmoid diverticulosis only. GI review of systems today is otherwise unremarkable. She states she has had improvement in her problems with diarrhea related irritable bowel. No lower GI complaints.  REVIEW OF SYSTEMS:  All non-GI ROS negative except for anxiety, sleeping problems  Past Medical History:  Diagnosis Date  . Anxiety   . Basal cell carcinoma   . Depression   . Esophageal stricture   . Fibroids   . GERD (gastroesophageal reflux disease)   . Hiatal hernia   . Hypertension   . Migraines   . Osteopenia   . PONV (postoperative nausea and vomiting)     Past Surgical History:  Procedure Laterality Date  . APPENDECTOMY  2014  . ESOPHAGEAL DILATION    . LAPAROSCOPIC APPENDECTOMY N/A 08/19/2013   Procedure: APPENDECTOMY LAPAROSCOPIC;  Surgeon: Odis Hollingshead, MD;  Location: [facility];  Service: General;  Laterality: N/A;  . PARTIAL HYSTERECTOMY      Social  History Megan Mason  reports that she has never smoked. She has never used smokeless tobacco. She reports that she does not drink alcohol or use drugs.  family history includes Stroke in her mother.  Allergies  Allergen Reactions  . Lipitor [Atorvastatin] Other (See Comments)    Made me feel funny  . Augmentin [Amoxicillin-Pot Clavulanate] Nausea And Vomiting and Other (See Comments)    Made me feel funny Has patient had a PCN reaction causing immediate rash, facial/tongue/throat swelling, SOB or lightheadedness with hypotension: No Has patient had a PCN reaction causing severe rash involving mucus membranes or skin necrosis: No Has patient had a PCN reaction that required hospitalization No Has patient had a PCN reaction occurring within the last 10 years: No If all of the above answers are "NO", then may proceed with Cephalosporin use.       PHYSICAL EXAMINATION: Vital signs: BP (!) 100/58   Pulse 76   Ht 5\' 5"  (1.651 m)   Wt 137 lb 4 oz (62.3 kg)   BMI 22.84 kg/m   Constitutional: generally well-appearing, no acute distress Psychiatric: alert and oriented x3, cooperative Eyes: extraocular movements intact, anicteric, conjunctiva pink Mouth: oral pharynx moist, no lesions Neck: supple no lymphadenopathy Cardiovascular: heart regular rate and rhythm, no murmur Lungs: clear to auscultation bilaterally Abdomen: soft, nontender, nondistended, no obvious ascites, no peritoneal signs, normal bowel sounds, no organomegaly Rectal: Omitted Extremities: no clubbing cyanosis or lower extremity edema bilaterally Skin: no lesions on visible extremities Neuro: No focal deficits. Cranial nerves intact  ASSESSMENT:  #1. GERD. Significant  deterioration off PPI and on H2 receptor antagonist therapy as an alternative #2. History of peptic stricture requiring esophageal dilation. The patient is experiencing some recurrent dysphagia off PPI #3. Concerns over chronic PPI use. The patient  is confused regarding this issue and what to do #4. Colon cancer screening. Last exam July 2008   PLAN:  #1. Reflux precautions #2. Long discussion on current knowledge base regarding potential issues with long-term PPI use including fractures, low magnesium, kidney disease, B12 and iron deficiencies, pneumonia, and kidney disease. The benefits of PPI therapy in this very symptomatic patient with history of esophageal damage secondary to GERD and symptoms refractory to H2 receptor antagonist therapy seemed to outweigh the theoretic risks. She understands #3. Prescribe omeprazole 20 mg daily #4. Contact the office if this is not effective and dysphagia does not resolve #5. Plans for follow-up screening colonoscopy this summer. She is aware  30 minutes was spent face-to-face with this patient. The overwhelming majority of the time was spent counseling regarding management of her chronic GERD and PPI related concerns

## 2016-01-30 NOTE — Patient Instructions (Signed)
Please follow up as needed 

## 2016-02-06 ENCOUNTER — Telehealth: Payer: Self-pay

## 2016-02-07 MED ORDER — OMEPRAZOLE 20 MG PO CPDR: 20.0000 mg | DELAYED_RELEASE_CAPSULE | Freq: Every day | ORAL | 1 refills | Status: AC

## 2016-02-07 NOTE — Telephone Encounter (Signed)
Refilled 90 day supply Omeprazole

## 2016-02-23 DIAGNOSIS — L821 Other seborrheic keratosis: Secondary | ICD-10-CM | POA: Diagnosis not present

## 2016-02-23 DIAGNOSIS — D2261 Melanocytic nevi of right upper limb, including shoulder: Secondary | ICD-10-CM | POA: Diagnosis not present

## 2016-02-23 DIAGNOSIS — Z85820 Personal history of malignant melanoma of skin: Secondary | ICD-10-CM | POA: Diagnosis not present

## 2016-02-23 DIAGNOSIS — Z85828 Personal history of other malignant neoplasm of skin: Secondary | ICD-10-CM | POA: Diagnosis not present

## 2016-02-23 DIAGNOSIS — D2262 Melanocytic nevi of left upper limb, including shoulder: Secondary | ICD-10-CM | POA: Diagnosis not present

## 2016-02-23 DIAGNOSIS — D1801 Hemangioma of skin and subcutaneous tissue: Secondary | ICD-10-CM | POA: Diagnosis not present

## 2016-02-23 DIAGNOSIS — D2271 Melanocytic nevi of right lower limb, including hip: Secondary | ICD-10-CM | POA: Diagnosis not present

## 2016-02-23 DIAGNOSIS — D224 Melanocytic nevi of scalp and neck: Secondary | ICD-10-CM | POA: Diagnosis not present

## 2016-04-30 DIAGNOSIS — Z1231 Encounter for screening mammogram for malignant neoplasm of breast: Secondary | ICD-10-CM | POA: Diagnosis not present

## 2016-05-23 ENCOUNTER — Encounter: Payer: Self-pay | Admitting: Internal Medicine

## 2016-08-27 DIAGNOSIS — Z85820 Personal history of malignant melanoma of skin: Secondary | ICD-10-CM | POA: Diagnosis not present

## 2016-08-27 DIAGNOSIS — D1801 Hemangioma of skin and subcutaneous tissue: Secondary | ICD-10-CM | POA: Diagnosis not present

## 2016-08-27 DIAGNOSIS — L821 Other seborrheic keratosis: Secondary | ICD-10-CM | POA: Diagnosis not present

## 2016-08-27 DIAGNOSIS — L812 Freckles: Secondary | ICD-10-CM | POA: Diagnosis not present

## 2016-08-27 DIAGNOSIS — Z85828 Personal history of other malignant neoplasm of skin: Secondary | ICD-10-CM | POA: Diagnosis not present

## 2016-08-27 DIAGNOSIS — D2271 Melanocytic nevi of right lower limb, including hip: Secondary | ICD-10-CM | POA: Diagnosis not present

## 2016-08-27 DIAGNOSIS — D2272 Melanocytic nevi of left lower limb, including hip: Secondary | ICD-10-CM | POA: Diagnosis not present

## 2016-08-30 DIAGNOSIS — C4330 Malignant melanoma of unspecified part of face: Secondary | ICD-10-CM | POA: Diagnosis not present

## 2016-08-30 DIAGNOSIS — I10 Essential (primary) hypertension: Secondary | ICD-10-CM | POA: Diagnosis not present

## 2016-08-30 DIAGNOSIS — E782 Mixed hyperlipidemia: Secondary | ICD-10-CM | POA: Diagnosis not present

## 2016-08-30 DIAGNOSIS — F3342 Major depressive disorder, recurrent, in full remission: Secondary | ICD-10-CM | POA: Diagnosis not present

## 2016-09-12 DIAGNOSIS — Z1211 Encounter for screening for malignant neoplasm of colon: Secondary | ICD-10-CM | POA: Diagnosis not present

## 2016-09-12 DIAGNOSIS — Z1212 Encounter for screening for malignant neoplasm of rectum: Secondary | ICD-10-CM | POA: Diagnosis not present

## 2017-02-26 DIAGNOSIS — L821 Other seborrheic keratosis: Secondary | ICD-10-CM | POA: Diagnosis not present

## 2017-02-26 DIAGNOSIS — Z85828 Personal history of other malignant neoplasm of skin: Secondary | ICD-10-CM | POA: Diagnosis not present

## 2017-02-26 DIAGNOSIS — L812 Freckles: Secondary | ICD-10-CM | POA: Diagnosis not present

## 2017-02-26 DIAGNOSIS — L570 Actinic keratosis: Secondary | ICD-10-CM | POA: Diagnosis not present

## 2017-02-26 DIAGNOSIS — D1801 Hemangioma of skin and subcutaneous tissue: Secondary | ICD-10-CM | POA: Diagnosis not present

## 2017-02-26 DIAGNOSIS — Z85820 Personal history of malignant melanoma of skin: Secondary | ICD-10-CM | POA: Diagnosis not present

## 2017-02-28 DIAGNOSIS — I10 Essential (primary) hypertension: Secondary | ICD-10-CM | POA: Diagnosis not present

## 2017-02-28 DIAGNOSIS — F3342 Major depressive disorder, recurrent, in full remission: Secondary | ICD-10-CM | POA: Diagnosis not present

## 2017-02-28 DIAGNOSIS — C4330 Malignant melanoma of unspecified part of face: Secondary | ICD-10-CM | POA: Diagnosis not present

## 2017-02-28 DIAGNOSIS — E559 Vitamin D deficiency, unspecified: Secondary | ICD-10-CM | POA: Diagnosis not present

## 2017-02-28 DIAGNOSIS — M8580 Other specified disorders of bone density and structure, unspecified site: Secondary | ICD-10-CM | POA: Diagnosis not present

## 2017-02-28 DIAGNOSIS — E782 Mixed hyperlipidemia: Secondary | ICD-10-CM | POA: Diagnosis not present

## 2017-02-28 DIAGNOSIS — Z0000 Encounter for general adult medical examination without abnormal findings: Secondary | ICD-10-CM | POA: Diagnosis not present

## 2017-05-02 DIAGNOSIS — Z1231 Encounter for screening mammogram for malignant neoplasm of breast: Secondary | ICD-10-CM | POA: Diagnosis not present

## 2017-05-02 DIAGNOSIS — M8589 Other specified disorders of bone density and structure, multiple sites: Secondary | ICD-10-CM | POA: Diagnosis not present

## 2017-09-03 DIAGNOSIS — L812 Freckles: Secondary | ICD-10-CM | POA: Diagnosis not present

## 2017-09-03 DIAGNOSIS — D1801 Hemangioma of skin and subcutaneous tissue: Secondary | ICD-10-CM | POA: Diagnosis not present

## 2017-09-03 DIAGNOSIS — L821 Other seborrheic keratosis: Secondary | ICD-10-CM | POA: Diagnosis not present

## 2017-09-03 DIAGNOSIS — Z85828 Personal history of other malignant neoplasm of skin: Secondary | ICD-10-CM | POA: Diagnosis not present

## 2017-09-03 DIAGNOSIS — D2272 Melanocytic nevi of left lower limb, including hip: Secondary | ICD-10-CM | POA: Diagnosis not present

## 2017-09-03 DIAGNOSIS — Z85820 Personal history of malignant melanoma of skin: Secondary | ICD-10-CM | POA: Diagnosis not present

## 2017-09-03 DIAGNOSIS — D2261 Melanocytic nevi of right upper limb, including shoulder: Secondary | ICD-10-CM | POA: Diagnosis not present

## 2017-09-03 DIAGNOSIS — L820 Inflamed seborrheic keratosis: Secondary | ICD-10-CM | POA: Diagnosis not present

## 2017-09-23 DIAGNOSIS — I10 Essential (primary) hypertension: Secondary | ICD-10-CM | POA: Diagnosis not present

## 2017-09-23 DIAGNOSIS — F411 Generalized anxiety disorder: Secondary | ICD-10-CM | POA: Diagnosis not present

## 2017-09-23 DIAGNOSIS — M8580 Other specified disorders of bone density and structure, unspecified site: Secondary | ICD-10-CM | POA: Diagnosis not present

## 2017-09-23 DIAGNOSIS — Z23 Encounter for immunization: Secondary | ICD-10-CM | POA: Diagnosis not present

## 2017-09-23 DIAGNOSIS — E782 Mixed hyperlipidemia: Secondary | ICD-10-CM | POA: Diagnosis not present

## 2017-09-23 DIAGNOSIS — K219 Gastro-esophageal reflux disease without esophagitis: Secondary | ICD-10-CM | POA: Diagnosis not present

## 2018-06-03 DIAGNOSIS — Z803 Family history of malignant neoplasm of breast: Secondary | ICD-10-CM | POA: Diagnosis not present

## 2018-06-03 DIAGNOSIS — Z1231 Encounter for screening mammogram for malignant neoplasm of breast: Secondary | ICD-10-CM | POA: Diagnosis not present

## 2018-09-09 DIAGNOSIS — L821 Other seborrheic keratosis: Secondary | ICD-10-CM | POA: Diagnosis not present

## 2018-09-09 DIAGNOSIS — D1801 Hemangioma of skin and subcutaneous tissue: Secondary | ICD-10-CM | POA: Diagnosis not present

## 2018-09-09 DIAGNOSIS — Z85820 Personal history of malignant melanoma of skin: Secondary | ICD-10-CM | POA: Diagnosis not present

## 2018-09-09 DIAGNOSIS — Z85828 Personal history of other malignant neoplasm of skin: Secondary | ICD-10-CM | POA: Diagnosis not present

## 2018-09-09 DIAGNOSIS — D2261 Melanocytic nevi of right upper limb, including shoulder: Secondary | ICD-10-CM | POA: Diagnosis not present

## 2018-09-09 DIAGNOSIS — D2272 Melanocytic nevi of left lower limb, including hip: Secondary | ICD-10-CM | POA: Diagnosis not present

## 2018-09-09 DIAGNOSIS — L853 Xerosis cutis: Secondary | ICD-10-CM | POA: Diagnosis not present

## 2018-10-23 DIAGNOSIS — M8580 Other specified disorders of bone density and structure, unspecified site: Secondary | ICD-10-CM | POA: Diagnosis not present

## 2018-10-23 DIAGNOSIS — F3342 Major depressive disorder, recurrent, in full remission: Secondary | ICD-10-CM | POA: Diagnosis not present

## 2018-10-23 DIAGNOSIS — E559 Vitamin D deficiency, unspecified: Secondary | ICD-10-CM | POA: Diagnosis not present

## 2018-10-23 DIAGNOSIS — E782 Mixed hyperlipidemia: Secondary | ICD-10-CM | POA: Diagnosis not present

## 2018-10-23 DIAGNOSIS — C4330 Malignant melanoma of unspecified part of face: Secondary | ICD-10-CM | POA: Diagnosis not present

## 2018-10-23 DIAGNOSIS — I10 Essential (primary) hypertension: Secondary | ICD-10-CM | POA: Diagnosis not present

## 2018-11-17 DIAGNOSIS — R7989 Other specified abnormal findings of blood chemistry: Secondary | ICD-10-CM | POA: Diagnosis not present

## 2018-11-17 DIAGNOSIS — Z0000 Encounter for general adult medical examination without abnormal findings: Secondary | ICD-10-CM | POA: Diagnosis not present

## 2019-03-11 ENCOUNTER — Emergency Department (INDEPENDENT_AMBULATORY_CARE_PROVIDER_SITE_OTHER)
Admission: EM | Admit: 2019-03-11 | Discharge: 2019-03-11 | Disposition: A | Discharge status: Home / Self Care | Payer: PPO | Source: Home / Self Care

## 2019-03-11 ENCOUNTER — Emergency Department (INDEPENDENT_AMBULATORY_CARE_PROVIDER_SITE_OTHER): Payer: PPO

## 2019-03-11 ENCOUNTER — Other Ambulatory Visit: Payer: Self-pay

## 2019-03-11 ENCOUNTER — Encounter: Payer: Self-pay | Admitting: *Deleted

## 2019-03-11 DIAGNOSIS — S6991XA Unspecified injury of right wrist, hand and finger(s), initial encounter: Secondary | ICD-10-CM | POA: Diagnosis not present

## 2019-03-11 DIAGNOSIS — S63501A Unspecified sprain of right wrist, initial encounter: Secondary | ICD-10-CM

## 2019-03-11 DIAGNOSIS — M25531 Pain in right wrist: Secondary | ICD-10-CM | POA: Diagnosis not present

## 2019-03-11 NOTE — Discharge Instructions (Signed)
  There was no definite fracture on your x-ray today, however, due to the pain and swelling, there is still a small chance you may have a tiny fracture (break) in your scaphoid bone at the bottom of your thumb.   It is recommended you wear your wrist splint for 1-2 weeks.   You may remove to apply a cool compress and to bath but should otherwise wear the splint.  If you still have pain in the area in 1-2 weeks, it is recommended you call to schedule a follow up with Sports Medicine for further evaluation and treatment.

## 2019-03-11 NOTE — ED Provider Notes (Signed)
[name]    CSN: [id number] Arrival date & time: 03/11/19  0802      History   Chief Complaint Chief Complaint  Patient presents with  . Wrist Injury    right wrist    HPI Megan Mason is a 74 y.o. female.   HPI  Megan Mason is a 74 y.o. female presenting to UC with c/o Right wrist pain with mild swelling that started last night after falling forward while trying to push a solar light into the ground.  Pain is sharp, 5/10, worse with movement.  She used ice with some relief.  She is Right hand dominant. No prior fracture or surgery to same wrist. No other injuries from the fall.    Past Medical History:  Diagnosis Date  . Anxiety   . Basal cell carcinoma   . Depression   . Esophageal stricture   . Fibroids   . GERD (gastroesophageal reflux disease)   . Hiatal hernia   . Hypertension   . Migraines   . Osteopenia   . PONV (postoperative nausea and vomiting)     Patient Active Problem List   Diagnosis Date Noted  . Appendicitis, acute 08/19/2013  . Acute appendicitis 08/19/2013    Past Surgical History:  Procedure Laterality Date  . APPENDECTOMY  2014  . ESOPHAGEAL DILATION    . LAPAROSCOPIC APPENDECTOMY N/A 08/19/2013   Procedure: APPENDECTOMY LAPAROSCOPIC;  Surgeon: Odis Hollingshead, MD;  Location: [facility];  Service: General;  Laterality: N/A;  . PARTIAL HYSTERECTOMY      OB History   No obstetric history on file.      Home Medications    Prior to Admission medications   Medication Sig Start Date End Date Taking? Authorizing Provider  doxylamine, Sleep, (UNISOM) 25 MG tablet Take 12.5 mg by mouth at bedtime.    [provider]  fenofibrate 54 MG tablet Take 54 mg by mouth daily. 12/15/13   [provider]  lisinopril-hydrochlorothiazide (PRINZIDE,ZESTORETIC) 20-25 MG tablet Take 1 tablet by mouth daily.    [provider]  Loperamide HCl (IMODIUM PO) Take 1 tablet by mouth daily.     [provider]  omeprazole (PRILOSEC) 20 MG capsule Take 1 capsule (20 mg total) by mouth daily. 02/07/16   Irene Shipper, MD  potassium chloride (K-DUR,KLOR-CON) 10 MEQ tablet Take 1 tablet by mouth daily.  07/20/13   [provider]  simvastatin (ZOCOR) 20 MG tablet Take 1 tablet by mouth daily. 08/18/13   [provider]  venlafaxine (EFFEXOR) 37.5 MG tablet Take 37.5 mg by mouth daily.    [provider]    Family History Family History  Problem Relation Age of Onset  . Diabetes Other   . Stroke Mother   . Hyperlipidemia Other   . Stomach cancer Neg Hx   . Colon cancer Neg Hx     Social History Social History   Tobacco Use  . Smoking status: Never Smoker  . Smokeless tobacco: Never Used  Substance Use Topics  . Alcohol use: No  . Drug use: No     Allergies   Lipitor [atorvastatin] and Augmentin [amoxicillin-pot clavulanate]   Review of Systems Review of Systems  Musculoskeletal: Positive for arthralgias and joint swelling.  Skin: Negative for color change and wound.  Neurological: Negative for weakness and numbness.     Physical Exam Triage Vital Signs ED Triage Vitals  Enc Vitals Group     BP  03/11/19 0819 118/68     Pulse Rate 03/11/19 0819 75     Resp 03/11/19 0819 14     Temp 03/11/19 0819 98.1 F (36.7 C)     Temp Source 03/11/19 0819 Oral     SpO2 03/11/19 0819 98 %     Weight 03/11/19 0820 143 lb (64.9 kg)     Height 03/11/19 0820 5' 4.5" (1.638 m)     Head Circumference --      Peak Flow --      Pain Score 03/11/19 0820 5     Pain Loc --      Pain Edu? --      Excl. in [place]? --    No data found.  Updated Vital Signs BP 118/68 (BP Location: Left Arm)   Pulse 75   Temp 98.1 F (36.7 C) (Oral)   Resp 14   Ht 5' 4.5" (1.638 m)   Wt 143 lb (64.9 kg)   SpO2 98%   BMI 24.17 kg/m   Visual Acuity Right Eye Distance:   Left Eye Distance:   Bilateral Distance:    Right Eye Near:   Left Eye Near:    Bilateral Near:      Physical Exam Vitals and nursing note reviewed.  Constitutional:      Appearance: Normal appearance. She is well-developed.  HENT:     Head: Normocephalic and atraumatic.  Cardiovascular:     Rate and Rhythm: Normal rate and regular rhythm.     Pulses:          Radial pulses are 2+ on the right side.  Pulmonary:     Effort: Pulmonary effort is normal.  Musculoskeletal:        General: Swelling and tenderness present.     Right wrist: Swelling, tenderness, bony tenderness and snuff box tenderness present. Decreased range of motion.     Cervical back: Normal range of motion.     Comments: Right wrist: mild edema over radial aspect. Mild tenderness. Decreased flexion and extension due to pain.  4/5 grip strength limited due to pain. No tenderness to fingers.   Right elbow: non-tender. Full ROM   Skin:    General: Skin is warm and dry.     Capillary Refill: Capillary refill takes less than 2 seconds.  Neurological:     Mental Status: She is alert and oriented to person, place, and time.     Sensory: No sensory deficit.  Psychiatric:        Behavior: Behavior normal.      UC Treatments / Results  Labs (all labs ordered are listed, but only abnormal results are displayed) Labs Reviewed - No data to display  EKG   Radiology DG Wrist Complete Right  Result Date: 03/11/2019 CLINICAL DATA:  [place] last night. Wrist pain. EXAM: RIGHT WRIST - COMPLETE 3+ VIEW COMPARISON:  None. FINDINGS: The joint spaces are maintained. No acute wrist fracture is identified. Mild degenerative changes. IMPRESSION: No definite acute wrist fracture. Electronically Signed   By: Marijo Sanes M.D.   On: 03/11/2019 09:08    Procedures Procedures (including critical care time)  Medications Ordered in UC Medications - No data to display  Initial Impression / Assessment and Plan / UC Course  I have reviewed the triage vital signs and the nursing notes.  Pertinent labs & imaging results that  were available during my care of the patient were reviewed by me and considered in my medical decision making (see  chart for details).    No definite fracture noted on imaging Reviewed with pt Placed in thumb spica splint due to location of pain and swelling.  Encouraged f/u in 1-2 weeks for recheck if still having symptoms AVS provided  Final Clinical Impressions(s) / UC Diagnoses   Final diagnoses:  Right wrist injury, initial encounter  Right wrist sprain, initial encounter     Discharge Instructions      There was no definite fracture on your x-ray today, however, due to the pain and swelling, there is still a small chance you may have a tiny fracture (break) in your scaphoid bone at the bottom of your thumb.   It is recommended you wear your wrist splint for 1-2 weeks.   You may remove to apply a cool compress and to bath but should otherwise wear the splint.  If you still have pain in the area in 1-2 weeks, it is recommended you call to schedule a follow up with Sports Medicine for further evaluation and treatment.     ED Prescriptions    None     PDMP not reviewed this encounter.   Noe Gens, [place] 03/11/19 [phone]

## 2019-03-11 NOTE — ED Triage Notes (Signed)
Patient reports while trying to push a solar light into the ground yesterday she fell forward, bending her right wrist. No previous injuries. Used ice.

## 2019-06-04 DIAGNOSIS — Z79899 Other long term (current) drug therapy: Secondary | ICD-10-CM | POA: Diagnosis not present

## 2019-06-04 DIAGNOSIS — E559 Vitamin D deficiency, unspecified: Secondary | ICD-10-CM | POA: Diagnosis not present

## 2019-06-04 DIAGNOSIS — M810 Age-related osteoporosis without current pathological fracture: Secondary | ICD-10-CM | POA: Diagnosis not present

## 2019-06-11 DIAGNOSIS — Z1231 Encounter for screening mammogram for malignant neoplasm of breast: Secondary | ICD-10-CM | POA: Diagnosis not present

## 2019-06-15 DIAGNOSIS — M810 Age-related osteoporosis without current pathological fracture: Secondary | ICD-10-CM | POA: Diagnosis not present

## 2019-06-22 DIAGNOSIS — E559 Vitamin D deficiency, unspecified: Secondary | ICD-10-CM | POA: Diagnosis not present

## 2019-06-22 DIAGNOSIS — M810 Age-related osteoporosis without current pathological fracture: Secondary | ICD-10-CM | POA: Diagnosis not present

## 2019-06-22 DIAGNOSIS — Z79899 Other long term (current) drug therapy: Secondary | ICD-10-CM | POA: Diagnosis not present

## 2019-06-30 DIAGNOSIS — R921 Mammographic calcification found on diagnostic imaging of breast: Secondary | ICD-10-CM | POA: Diagnosis not present

## 2019-07-14 DIAGNOSIS — Z1211 Encounter for screening for malignant neoplasm of colon: Secondary | ICD-10-CM | POA: Diagnosis not present

## 2019-07-14 DIAGNOSIS — I10 Essential (primary) hypertension: Secondary | ICD-10-CM | POA: Diagnosis not present

## 2019-07-14 DIAGNOSIS — E782 Mixed hyperlipidemia: Secondary | ICD-10-CM | POA: Diagnosis not present

## 2019-07-14 DIAGNOSIS — F3342 Major depressive disorder, recurrent, in full remission: Secondary | ICD-10-CM | POA: Diagnosis not present

## 2019-07-14 DIAGNOSIS — Z130 Encounter for screening for diseases of the blood and blood-forming organs and certain disorders involving the immune mechanism: Secondary | ICD-10-CM | POA: Diagnosis not present

## 2019-07-14 DIAGNOSIS — C4330 Malignant melanoma of unspecified part of face: Secondary | ICD-10-CM | POA: Diagnosis not present

## 2019-07-14 DIAGNOSIS — F411 Generalized anxiety disorder: Secondary | ICD-10-CM | POA: Diagnosis not present

## 2019-07-27 ENCOUNTER — Other Ambulatory Visit: Payer: Self-pay | Admitting: Radiology

## 2019-07-27 DIAGNOSIS — N6012 Diffuse cystic mastopathy of left breast: Secondary | ICD-10-CM | POA: Diagnosis not present

## 2019-07-27 DIAGNOSIS — R921 Mammographic calcification found on diagnostic imaging of breast: Secondary | ICD-10-CM | POA: Diagnosis not present

## 2019-08-13 DIAGNOSIS — Z1211 Encounter for screening for malignant neoplasm of colon: Secondary | ICD-10-CM | POA: Diagnosis not present

## 2019-09-15 DIAGNOSIS — L821 Other seborrheic keratosis: Secondary | ICD-10-CM | POA: Diagnosis not present

## 2019-09-15 DIAGNOSIS — D2272 Melanocytic nevi of left lower limb, including hip: Secondary | ICD-10-CM | POA: Diagnosis not present

## 2019-09-15 DIAGNOSIS — L820 Inflamed seborrheic keratosis: Secondary | ICD-10-CM | POA: Diagnosis not present

## 2019-09-15 DIAGNOSIS — D2271 Melanocytic nevi of right lower limb, including hip: Secondary | ICD-10-CM | POA: Diagnosis not present

## 2019-09-15 DIAGNOSIS — Z85820 Personal history of malignant melanoma of skin: Secondary | ICD-10-CM | POA: Diagnosis not present

## 2019-09-15 DIAGNOSIS — L814 Other melanin hyperpigmentation: Secondary | ICD-10-CM | POA: Diagnosis not present

## 2019-09-15 DIAGNOSIS — L812 Freckles: Secondary | ICD-10-CM | POA: Diagnosis not present

## 2019-09-15 DIAGNOSIS — Z85828 Personal history of other malignant neoplasm of skin: Secondary | ICD-10-CM | POA: Diagnosis not present

## 2019-09-15 DIAGNOSIS — D1801 Hemangioma of skin and subcutaneous tissue: Secondary | ICD-10-CM | POA: Diagnosis not present

## 2019-11-02 DIAGNOSIS — K219 Gastro-esophageal reflux disease without esophagitis: Secondary | ICD-10-CM | POA: Diagnosis not present

## 2019-11-02 DIAGNOSIS — K580 Irritable bowel syndrome with diarrhea: Secondary | ICD-10-CM | POA: Diagnosis not present

## 2019-11-02 DIAGNOSIS — R197 Diarrhea, unspecified: Secondary | ICD-10-CM | POA: Diagnosis not present

## 2019-11-03 DIAGNOSIS — R197 Diarrhea, unspecified: Secondary | ICD-10-CM | POA: Diagnosis not present

## 2019-11-19 DIAGNOSIS — E559 Vitamin D deficiency, unspecified: Secondary | ICD-10-CM | POA: Diagnosis not present

## 2019-11-19 DIAGNOSIS — Z23 Encounter for immunization: Secondary | ICD-10-CM | POA: Diagnosis not present

## 2019-11-19 DIAGNOSIS — I10 Essential (primary) hypertension: Secondary | ICD-10-CM | POA: Diagnosis not present

## 2019-11-19 DIAGNOSIS — F3342 Major depressive disorder, recurrent, in full remission: Secondary | ICD-10-CM | POA: Diagnosis not present

## 2019-11-19 DIAGNOSIS — E782 Mixed hyperlipidemia: Secondary | ICD-10-CM | POA: Diagnosis not present

## 2019-11-19 DIAGNOSIS — Z0000 Encounter for general adult medical examination without abnormal findings: Secondary | ICD-10-CM | POA: Diagnosis not present

## 2019-11-19 DIAGNOSIS — K219 Gastro-esophageal reflux disease without esophagitis: Secondary | ICD-10-CM | POA: Diagnosis not present

## 2019-11-25 ENCOUNTER — Other Ambulatory Visit: Payer: Self-pay

## 2019-11-25 ENCOUNTER — Emergency Department (INDEPENDENT_AMBULATORY_CARE_PROVIDER_SITE_OTHER): Payer: PPO

## 2019-11-25 ENCOUNTER — Emergency Department
Admission: EM | Admit: 2019-11-25 | Discharge: 2019-11-25 | Disposition: A | Discharge status: Home / Self Care | Payer: PPO | Source: Home / Self Care | Attending: Family Medicine | Admitting: Family Medicine

## 2019-11-25 DIAGNOSIS — R0789 Other chest pain: Secondary | ICD-10-CM

## 2019-11-25 DIAGNOSIS — R079 Chest pain, unspecified: Secondary | ICD-10-CM | POA: Diagnosis not present

## 2019-11-25 NOTE — ED Provider Notes (Signed)
[facility]    CSN: [id number] Arrival date & time: 11/25/19  1226      History   Chief Complaint Chief Complaint  Patient presents with  . Chest Pain    x5 days    HPI Megan Mason is a 74 y.o. female.   Patient complains of constant centralized anterior chest pain for about 5 days.  The pain started when she was lifting some heavy shopping bags.  The pain is worse with movement, and better when sitting/relaxing.  The pain does not radiate.  She denies shortness of breath and nausea/vomiting.  The history is provided by the patient.  Chest Pain Pain location:  Substernal area Pain quality: aching   Pain radiates to:  Does not radiate Pain severity:  Mild Onset quality:  Sudden Duration:  5 days Timing:  Intermittent Progression:  Unchanged Chronicity:  New Context: breathing, lifting, movement and at rest   Relieved by:  Rest Worsened by:  Certain positions, deep breathing, exertion and movement Ineffective treatments:  None tried Associated symptoms: no abdominal pain, no AICD problem, no anorexia, no anxiety, no back pain, no cough, no diaphoresis, no dysphagia, no fatigue, no fever, no heartburn, no lower extremity edema, no nausea, no palpitations, no PND, no shortness of breath, no syncope and no vomiting     Past Medical History:  Diagnosis Date  . Anxiety   . Basal cell carcinoma   . Depression   . Esophageal stricture   . Fibroids   . GERD (gastroesophageal reflux disease)   . Hiatal hernia   . Hypertension   . Migraines   . Osteopenia   . PONV (postoperative nausea and vomiting)     Patient Active Problem List   Diagnosis Date Noted  . Appendicitis, acute 08/19/2013  . Acute appendicitis 08/19/2013    Past Surgical History:  Procedure Laterality Date  . APPENDECTOMY  2014  . ESOPHAGEAL DILATION    . LAPAROSCOPIC APPENDECTOMY N/A 08/19/2013   Procedure: APPENDECTOMY LAPAROSCOPIC;  Surgeon: Odis Hollingshead, MD;  Location: [facility];  Service: General;  Laterality: N/A;  . PARTIAL HYSTERECTOMY      OB History   No obstetric history on file.      Home Medications    Prior to Admission medications   Medication Sig Start Date End Date Taking? Authorizing Provider  doxylamine, Sleep, (UNISOM) 25 MG tablet Take 12.5 mg by mouth at bedtime.    [provider]  fenofibrate 54 MG tablet Take 54 mg by mouth daily. 12/15/13   [provider]  lisinopril-hydrochlorothiazide (PRINZIDE,ZESTORETIC) 20-25 MG tablet Take 1 tablet by mouth daily.    [provider]  Loperamide HCl (IMODIUM PO) Take 1 tablet by mouth daily.     [provider]  omeprazole (PRILOSEC) 20 MG capsule Take 1 capsule (20 mg total) by mouth daily. 02/07/16   Irene Shipper, MD  potassium chloride (K-DUR,KLOR-CON) 10 MEQ tablet Take 1 tablet by mouth daily.  07/20/13   [provider]  simvastatin (ZOCOR) 20 MG tablet Take 1 tablet by mouth daily. 08/18/13   [provider]  venlafaxine (EFFEXOR) 37.5 MG tablet Take 37.5 mg by mouth daily.    [provider]    Family History Family History  Problem Relation Age of Onset  . Diabetes Other   . Stroke Mother   . Hypertension Mother   . Hyperlipidemia Other   . Hypertension Father   . Stomach cancer Neg Hx   .  Colon cancer Neg Hx     Social History Social History   Tobacco Use  . Smoking status: Never Smoker  . Smokeless tobacco: Never Used  Substance Use Topics  . Alcohol use: No  . Drug use: No     Allergies   Lipitor [atorvastatin] and Augmentin [amoxicillin-pot clavulanate]   Review of Systems Review of Systems  Constitutional: Negative for activity change, appetite change, chills, diaphoresis, fatigue and fever.  HENT: Negative for trouble swallowing.   Respiratory: Positive for chest tightness. Negative for cough, shortness of breath, wheezing and stridor.   Cardiovascular: Positive for chest pain. Negative for  palpitations, syncope and PND.  Gastrointestinal: Negative for abdominal pain, anorexia, heartburn, nausea and vomiting.  Musculoskeletal: Negative for back pain.  All other systems reviewed and are negative.    Physical Exam Triage Vital Signs ED Triage Vitals  Enc Vitals Group     BP 11/25/19 1238 117/68     Pulse Rate 11/25/19 1238 66     Resp 11/25/19 1238 14     Temp 11/25/19 1238 98.6 F (37 C)     Temp Source 11/25/19 1238 Oral     SpO2 11/25/19 1238 99 %     Weight --      Height --      Head Circumference --      Peak Flow --      Pain Score 11/25/19 1236 4     Pain Loc --      Pain Edu? --      Excl. in [place]? --    No data found.  Updated Vital Signs BP 117/68 (BP Location: Right Arm)   Pulse 66   Temp 98.6 F (37 C) (Oral)   Resp 14   SpO2 99%   Visual Acuity Right Eye Distance:   Left Eye Distance:   Bilateral Distance:    Right Eye Near:   Left Eye Near:    Bilateral Near:     Physical Exam Vitals and nursing note reviewed.  Constitutional:      General: She is not in acute distress. HENT:     Head: Normocephalic.     Right Ear: External ear normal.     Left Ear: External ear normal.     Nose: Nose normal.     Mouth/Throat:     Pharynx: Oropharynx is clear.  Eyes:     Pupils: Pupils are equal, round, and reactive to light.  Cardiovascular:     Rate and Rhythm: Normal rate and regular rhythm.     Heart sounds: Normal heart sounds.  Pulmonary:     Breath sounds: Normal breath sounds.  Chest:       Comments: Distribution of pain as noted on diagram; there is no tenderness to palpation in this area. Abdominal:     Palpations: Abdomen is soft.     Tenderness: There is no abdominal tenderness.  Musculoskeletal:        General: No tenderness.     Cervical back: Neck supple.     Right lower leg: No edema.     Left lower leg: No edema.  Lymphadenopathy:     Cervical: No cervical adenopathy.  Skin:    General: Skin is warm and dry.      Findings: No rash.  Neurological:     Mental Status: She is alert and oriented to person, place, and time.      UC Treatments / Results  Labs (all labs ordered are  listed, but only abnormal results are displayed) Labs Reviewed - No data to display  EKG  Rate:  64 BPM PR:  156 msec QT:  450 msec QTcH:  464 msec QRSD:   72 msec QRS axis:  -37 degrees Interpretation:  Low voltage QRS; non-specific ST and T wave abnormality.  No acute changes.  Radiology DG Chest 2 View  Result Date: 11/25/2019 CLINICAL DATA:  Chest pain. EXAM: CHEST - 2 VIEW COMPARISON:  08/19/2013. FINDINGS: The heart size and mediastinal contours are within normal limits. Both lungs are clear. No visible pleural effusions or pneumothorax. Biapical pleuroparenchymal scarring. Chronic hyperinflation. No acute osseous abnormality. Thoracic spine degenerative change. IMPRESSION: 1. No active cardiopulmonary disease. 2. Chronic hyperinflation, suggesting emphysema/COPD. Electronically Signed   By: Margaretha Sheffield MD   On: 11/25/2019 13:26    Procedures Procedures (including critical care time)  Medications Ordered in UC Medications - No data to display  Initial Impression / Assessment and Plan / UC Course  I have reviewed the triage vital signs and the nursing notes.  Pertinent labs & imaging results that were available during my care of the patient were reviewed by me and considered in my medical decision making (see chart for details).    No acute changes on EKG.  X-ray chest stable. Followup with Family Doctor if not improved in about 2 weeks.   Final Clinical Impressions(s) / UC Diagnoses   Final diagnoses:  Chest wall pain     Discharge Instructions     Apply ice pack to front of chest for 20 to 30 minutes, 2 to 3 times daily  Continue until pain decreases.  May take Tylenol as needed for pain.    ED Prescriptions    None        Kandra Nicolas, MD 12/02/19 [phone]

## 2019-11-25 NOTE — Discharge Instructions (Addendum)
Apply ice pack to front of chest for 20 to 30 minutes, 2 to 3 times daily  Continue until pain decreases.  May take Tylenol as needed for pain.

## 2019-11-25 NOTE — ED Triage Notes (Signed)
Patient presents to Urgent Care with complaints of centralized chest pain since about 5 days ago. Patient reports she thinks she might have pulled a muscle while shopping because her hands were really full, pain gets better when she sits and relaxes. Called her PCP and the office suggested she come in for evaluation.

## 2020-06-09 DIAGNOSIS — Z79899 Other long term (current) drug therapy: Secondary | ICD-10-CM | POA: Diagnosis not present

## 2020-06-09 DIAGNOSIS — Z8639 Personal history of other endocrine, nutritional and metabolic disease: Secondary | ICD-10-CM | POA: Diagnosis not present

## 2020-06-09 DIAGNOSIS — M810 Age-related osteoporosis without current pathological fracture: Secondary | ICD-10-CM | POA: Diagnosis not present

## 2020-06-20 DIAGNOSIS — M810 Age-related osteoporosis without current pathological fracture: Secondary | ICD-10-CM | POA: Diagnosis not present

## 2020-06-20 DIAGNOSIS — Z7983 Long term (current) use of bisphosphonates: Secondary | ICD-10-CM | POA: Diagnosis not present

## 2020-06-21 DIAGNOSIS — F3342 Major depressive disorder, recurrent, in full remission: Secondary | ICD-10-CM | POA: Diagnosis not present

## 2020-06-21 DIAGNOSIS — I10 Essential (primary) hypertension: Secondary | ICD-10-CM | POA: Diagnosis not present

## 2020-06-21 DIAGNOSIS — E782 Mixed hyperlipidemia: Secondary | ICD-10-CM | POA: Diagnosis not present

## 2020-06-21 DIAGNOSIS — K219 Gastro-esophageal reflux disease without esophagitis: Secondary | ICD-10-CM | POA: Diagnosis not present

## 2020-08-16 DIAGNOSIS — Z1231 Encounter for screening mammogram for malignant neoplasm of breast: Secondary | ICD-10-CM | POA: Diagnosis not present

## 2020-09-14 DIAGNOSIS — L723 Sebaceous cyst: Secondary | ICD-10-CM | POA: Diagnosis not present

## 2020-09-14 DIAGNOSIS — D2262 Melanocytic nevi of left upper limb, including shoulder: Secondary | ICD-10-CM | POA: Diagnosis not present

## 2020-09-14 DIAGNOSIS — L821 Other seborrheic keratosis: Secondary | ICD-10-CM | POA: Diagnosis not present

## 2020-09-14 DIAGNOSIS — D1801 Hemangioma of skin and subcutaneous tissue: Secondary | ICD-10-CM | POA: Diagnosis not present

## 2020-09-14 DIAGNOSIS — Z85828 Personal history of other malignant neoplasm of skin: Secondary | ICD-10-CM | POA: Diagnosis not present

## 2020-09-14 DIAGNOSIS — Z85820 Personal history of malignant melanoma of skin: Secondary | ICD-10-CM | POA: Diagnosis not present

## 2020-09-14 DIAGNOSIS — D2261 Melanocytic nevi of right upper limb, including shoulder: Secondary | ICD-10-CM | POA: Diagnosis not present

## 2020-09-14 DIAGNOSIS — D2272 Melanocytic nevi of left lower limb, including hip: Secondary | ICD-10-CM | POA: Diagnosis not present

## 2020-09-14 DIAGNOSIS — D2271 Melanocytic nevi of right lower limb, including hip: Secondary | ICD-10-CM | POA: Diagnosis not present

## 2020-09-14 DIAGNOSIS — L570 Actinic keratosis: Secondary | ICD-10-CM | POA: Diagnosis not present

## 2020-12-13 DIAGNOSIS — Z0000 Encounter for general adult medical examination without abnormal findings: Secondary | ICD-10-CM | POA: Diagnosis not present

## 2021-01-09 ENCOUNTER — Emergency Department (INDEPENDENT_AMBULATORY_CARE_PROVIDER_SITE_OTHER): Payer: PPO

## 2021-01-09 ENCOUNTER — Other Ambulatory Visit: Payer: Self-pay

## 2021-01-09 ENCOUNTER — Emergency Department
Admission: EM | Admit: 2021-01-09 | Discharge: 2021-01-09 | Disposition: A | Discharge status: Home / Self Care | Payer: PPO | Source: Home / Self Care

## 2021-01-09 DIAGNOSIS — M79605 Pain in left leg: Secondary | ICD-10-CM | POA: Diagnosis not present

## 2021-01-09 DIAGNOSIS — M79662 Pain in left lower leg: Secondary | ICD-10-CM

## 2021-01-09 NOTE — Discharge Instructions (Addendum)
Advised/informed patient ultrasound Doppler left leg was negative for DVT.  Patient provided copy of study results prior to discharge.  Advised patient may use OTC Ibuprofen 200 to 400 mg 1-2 times daily, as needed for leg pain.  Advised patient if pain worsens and/or unresolved please follow-up with PCP or here for further evaluation.

## 2021-01-09 NOTE — ED Provider Notes (Signed)
[name]    CSN: [id number] Arrival date & time: 01/09/21  1256      History   Chief Complaint Chief Complaint  Patient presents with   Leg Pain    HPI Tonyetta C Choquette is a 76 y.o. female.   HPI 76 year old female presents with left upper thigh pain since this morning.  Patient describes pain as sore and burning sensation.  PMH significant for HTN, BCC, and HTN.  Past Medical History:  Diagnosis Date   Anxiety    Basal cell carcinoma    Depression    Esophageal stricture    Fibroids    GERD (gastroesophageal reflux disease)    Hiatal hernia    Hypertension    Migraines    Osteopenia    PONV (postoperative nausea and vomiting)     Patient Active Problem List   Diagnosis Date Noted   Appendicitis, acute 08/19/2013   Acute appendicitis 08/19/2013    Past Surgical History:  Procedure Laterality Date   APPENDECTOMY  2014   ESOPHAGEAL DILATION     LAPAROSCOPIC APPENDECTOMY N/A 08/19/2013   Procedure: APPENDECTOMY LAPAROSCOPIC;  Surgeon: Odis Hollingshead, MD;  Location: [facility];  Service: General;  Laterality: N/A;   PARTIAL HYSTERECTOMY      OB History   No obstetric history on file.      Home Medications    Prior to Admission medications   Medication Sig Start Date End Date Taking? Authorizing Provider  doxylamine, Sleep, (UNISOM) 25 MG tablet Take 12.5 mg by mouth at bedtime.    [provider]  fenofibrate 54 MG tablet Take 54 mg by mouth daily. 12/15/13   [provider]  lisinopril-hydrochlorothiazide (PRINZIDE,ZESTORETIC) 20-25 MG tablet Take 1 tablet by mouth daily.    [provider]  Loperamide HCl (IMODIUM PO) Take 1 tablet by mouth daily.     [provider]  omeprazole (PRILOSEC) 20 MG capsule Take 1 capsule (20 mg total) by mouth daily. 02/07/16   Irene Shipper, MD  potassium chloride (K-DUR,KLOR-CON) 10 MEQ tablet Take 1 tablet by mouth daily.  07/20/13   [provider]  simvastatin  (ZOCOR) 20 MG tablet Take 1 tablet by mouth daily. 08/18/13   [provider]  venlafaxine (EFFEXOR) 37.5 MG tablet Take 37.5 mg by mouth daily.    [provider]    Family History Family History  Problem Relation Age of Onset   Diabetes Other    Stroke Mother    Hypertension Mother    Hyperlipidemia Other    Hypertension Father    Stomach cancer Neg Hx    Colon cancer Neg Hx     Social History Social History   Tobacco Use   Smoking status: Never   Smokeless tobacco: Never  Vaping Use   Vaping Use: Never used  Substance Use Topics   Alcohol use: No   Drug use: No     Allergies   Lipitor [atorvastatin] and Augmentin [amoxicillin-pot clavulanate]   Review of Systems Review of Systems  Musculoskeletal:        Left upper leg pain sore to touch and burning sensation since earlier this morning.    Physical Exam Triage Vital Signs ED Triage Vitals  Enc Vitals Group     BP 01/09/21 1354 105/63     Pulse Rate 01/09/21 1354 71     Resp 01/09/21 1354 20     Temp 01/09/21 1354 98.6 F (37 C)  Temp Source 01/09/21 1354 Oral     SpO2 01/09/21 1354 98 %     Weight 01/09/21 1351 140 lb (63.5 kg)     Height 01/09/21 1351 5' 3.5" (1.613 m)     Head Circumference --      Peak Flow --      Pain Score 01/09/21 1351 0     Pain Loc --      Pain Edu? --      Excl. in [place]? --    No data found.  Updated Vital Signs BP 105/63    Pulse 71    Temp 98.6 F (37 C) (Oral)    Resp 20    Ht 5' 3.5" (1.613 m)    Wt 140 lb (63.5 kg)    SpO2 98%    BMI 24.41 kg/m      Physical Exam Vitals and nursing note reviewed. Exam conducted with a chaperone present.  Constitutional:      General: She is not in acute distress.    Appearance: Normal appearance. She is normal weight. She is not ill-appearing.  HENT:     Head: Normocephalic and atraumatic.     Mouth/Throat:     Mouth: Mucous membranes are moist.     Pharynx: Oropharynx is clear.  Eyes:     Extraocular  Movements: Extraocular movements intact.     Conjunctiva/sclera: Conjunctivae normal.     Pupils: Pupils are equal, round, and reactive to light.  Cardiovascular:     Rate and Rhythm: Normal rate and regular rhythm.     Pulses: Normal pulses.     Heart sounds: Normal heart sounds. No murmur heard.   No friction rub. No gallop.     Comments: Left leg: PT/DP +2 and bounding Pulmonary:     Effort: Pulmonary effort is normal.     Breath sounds: Normal breath sounds.  Musculoskeletal:     Cervical back: Normal range of motion and neck supple. No tenderness.     Comments: Left upper leg (superior medial aspect): Moderate to severe TTP, patient denies accompanying symptoms (denies anginal equivalents, shortness of breath, lightheadedness/dizziness, presyncopal or syncopal episodes)  Lymphadenopathy:     Cervical: No cervical adenopathy.  Skin:    General: Skin is warm and dry.  Neurological:     General: No focal deficit present.     Mental Status: She is alert and oriented to person, place, and time.     UC Treatments / Results  Labs (all labs ordered are listed, but only abnormal results are displayed) Labs Reviewed - No data to display  EKG   Radiology US Venous Img Lower Unilateral Left  Result Date: 01/09/2021 CLINICAL DATA:  Intermittent left lower extremity pain for the past day. Evaluate for DVT. EXAM: LEFT LOWER EXTREMITY VENOUS DOPPLER ULTRASOUND TECHNIQUE: Gray-scale sonography with graded compression, as well as color Doppler and duplex ultrasound were performed to evaluate the lower extremity deep venous systems from the level of the common femoral vein and including the common femoral, femoral, profunda femoral, popliteal and calf veins including the posterior tibial, peroneal and gastrocnemius veins when visible. The superficial great saphenous vein was also interrogated. Spectral Doppler was utilized to evaluate flow at rest and with distal augmentation maneuvers in the  common femoral, femoral and popliteal veins. COMPARISON:  None. FINDINGS: Contralateral Common Femoral Vein: Respiratory phasicity is normal and symmetric with the symptomatic side. No evidence of thrombus. Normal compressibility. Common Femoral Vein: No evidence of thrombus. Normal  compressibility, respiratory phasicity and response to augmentation. Saphenofemoral Junction: No evidence of thrombus. Normal compressibility and flow on color Doppler imaging. Profunda Femoral Vein: No evidence of thrombus. Normal compressibility and flow on color Doppler imaging. Femoral Vein: No evidence of thrombus. Normal compressibility, respiratory phasicity and response to augmentation. Popliteal Vein: No evidence of thrombus. Normal compressibility, respiratory phasicity and response to augmentation. Calf Veins: No evidence of thrombus. Normal compressibility and flow on color Doppler imaging. Superficial Great Saphenous Vein: No evidence of thrombus. Normal compressibility. Venous Reflux:  None. Other Findings:  None. IMPRESSION: No evidence of DVT within the left lower extremity. Electronically Signed   By: Sandi Mariscal M.D.   On: 01/09/2021 15:14    Procedures Procedures (including critical care time)  Medications Ordered in UC Medications - No data to display  Initial Impression / Assessment and Plan / UC Course  I have reviewed the triage vital signs and the nursing notes.  Pertinent labs & imaging results that were available during my care of the patient were reviewed by me and considered in my medical decision making (see chart for details).     MDM: 1.  Anterior leg pain, left-Advised/informed patient ultrasound Doppler left leg was negative for DVT.  Patient provided copy of study results prior to discharge.  Advised patient may use OTC Ibuprofen 200 to 400 mg 1-2 times daily, as needed for leg pain.  Advised patient if pain worsens and/or unresolved please follow-up with PCP or here for further  evaluation.  Patient discharged home, hemodynamically stable. Final Clinical Impressions(s) / UC Diagnoses   Final diagnoses:  Anterior leg pain, left     Discharge Instructions      Advised/informed patient ultrasound Doppler left leg was negative for DVT.  Patient provided copy of study results prior to discharge.  Advised patient may use OTC Ibuprofen 200 to 400 mg 1-2 times daily, as needed for leg pain.  Advised patient if pain worsens and/or unresolved please follow-up with PCP or here for further evaluation.     ED Prescriptions   None    PDMP not reviewed this encounter.   Eliezer Lofts, [place] 01/09/21 1530

## 2021-01-09 NOTE — ED Triage Notes (Signed)
Pt presents to Urgent Care with c/o pain to L upper, inner thigh since this morning. Pt describes pain as "sore" and also a "burning sensation"

## 2021-05-21 IMAGING — DX DG WRIST COMPLETE 3+V*R*
4 series · 4 of 4 positions shown · non-contrast
Comparison: None.

CLINICAL DATA: Fell last night. Wrist pain.

EXAM:
RIGHT WRIST - COMPLETE 3+ VIEW

[wrist pa]
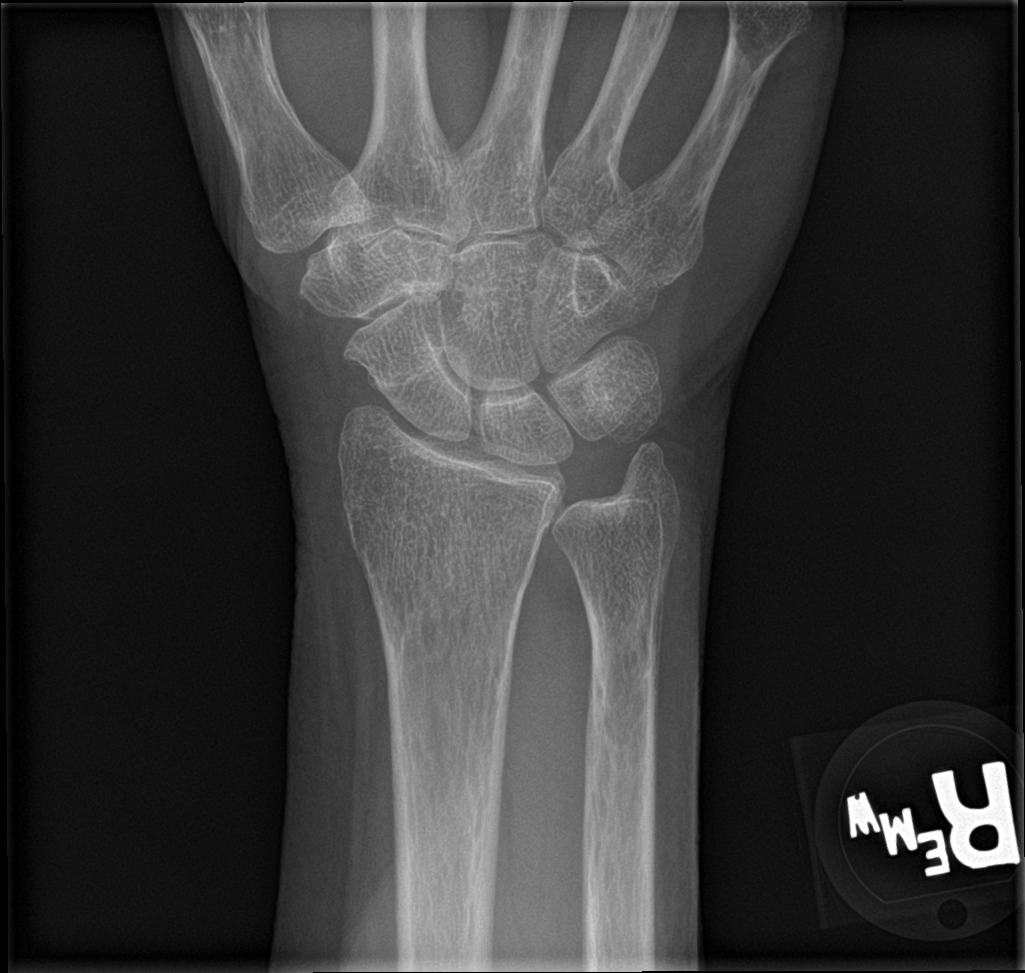

[wrist obl]
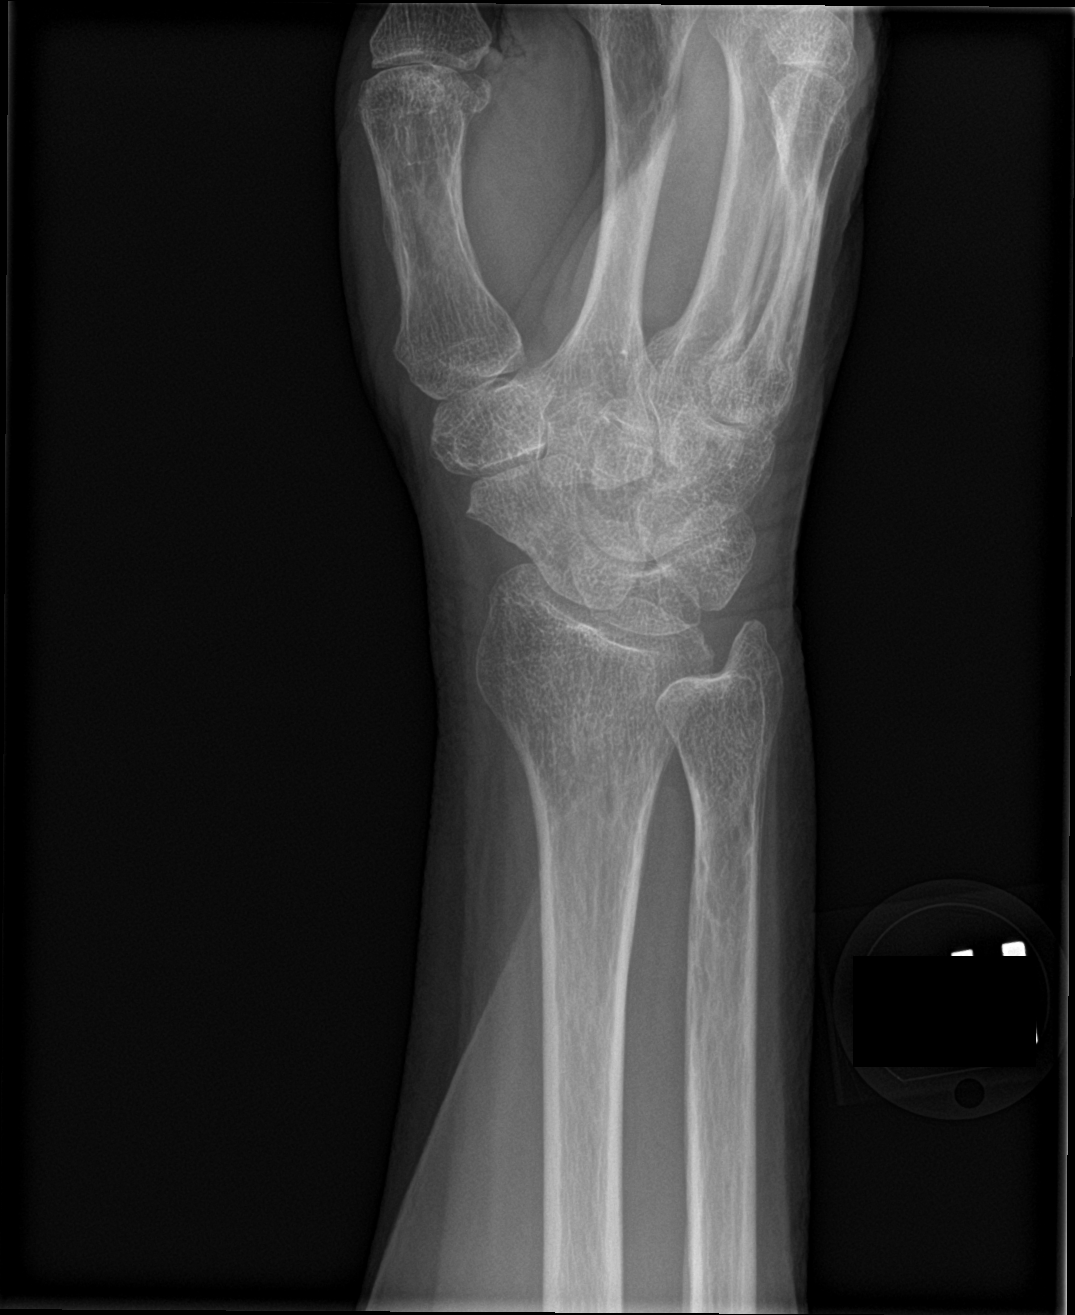

[wrist lat]
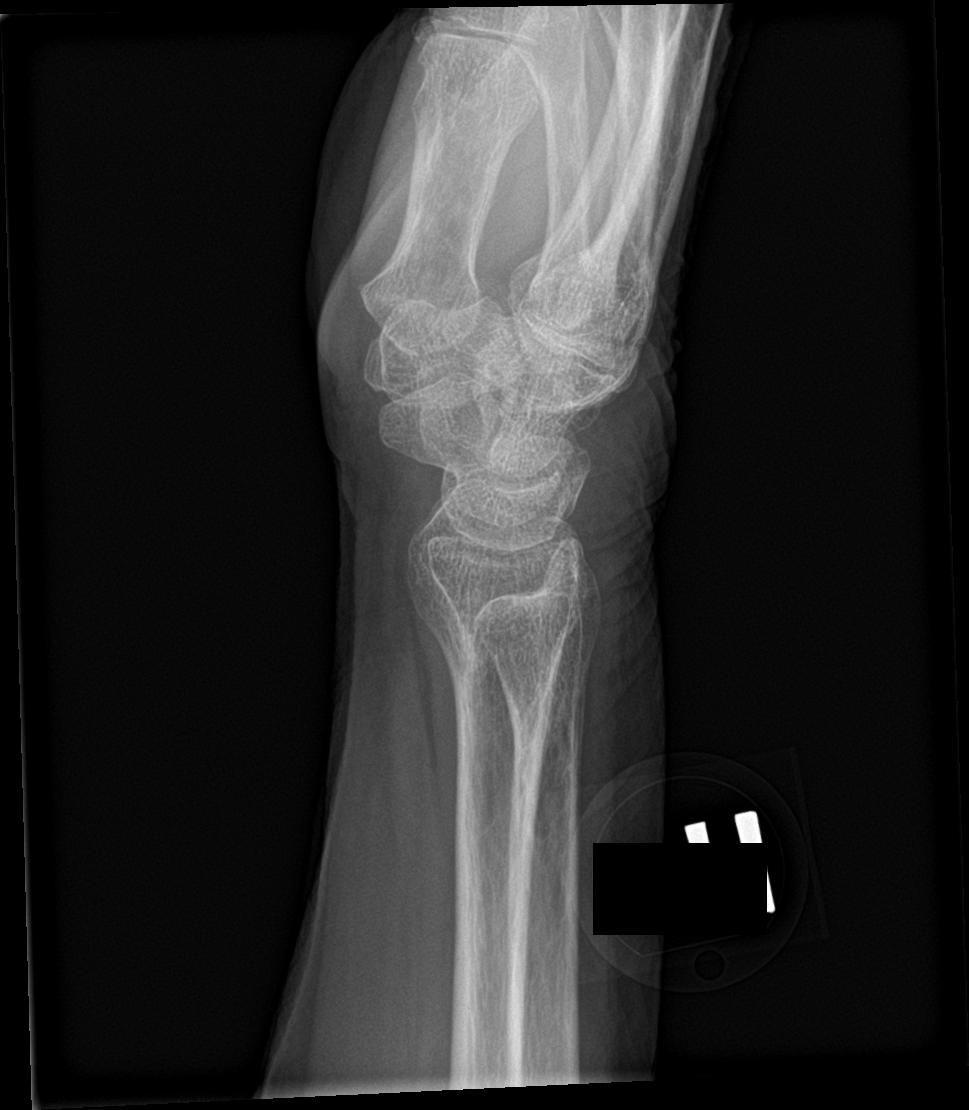

[wrist navicular]
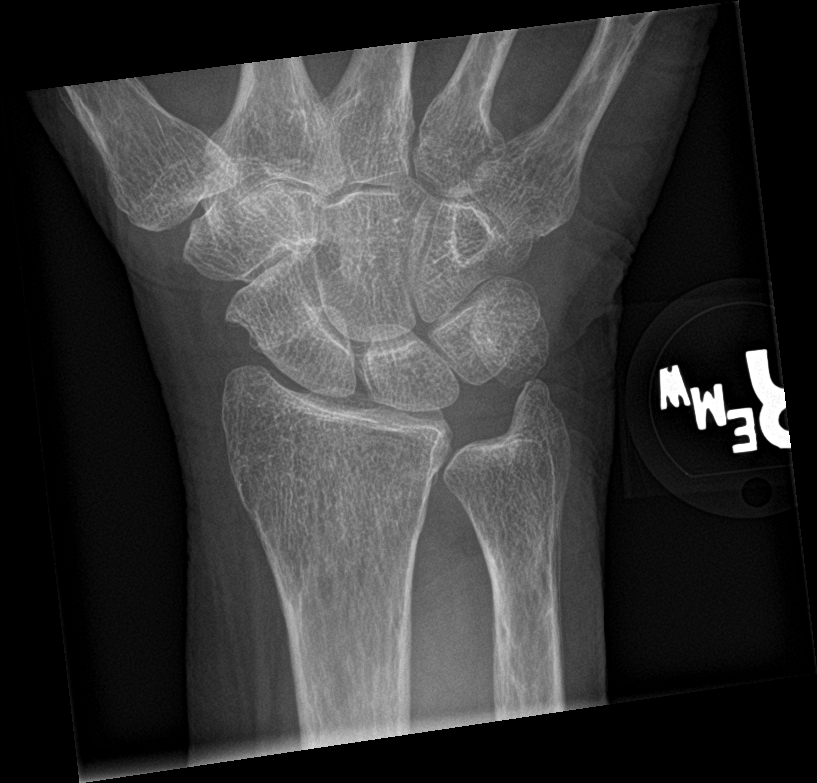

[4 of 4 positions shown; findings below may reference images not displayed]

FINDINGS: The joint spaces are maintained. No acute wrist fracture is
identified. Mild degenerative changes.
IMPRESSION: No definite acute wrist fracture.

## 2022-02-04 IMAGING — DX DG CHEST 2V
2 series · 2 of 2 positions shown · non-contrast
Comparison: 08/19/2013.

CLINICAL DATA: Chest pain.

EXAM:
CHEST - 2 VIEW

[chest pa]
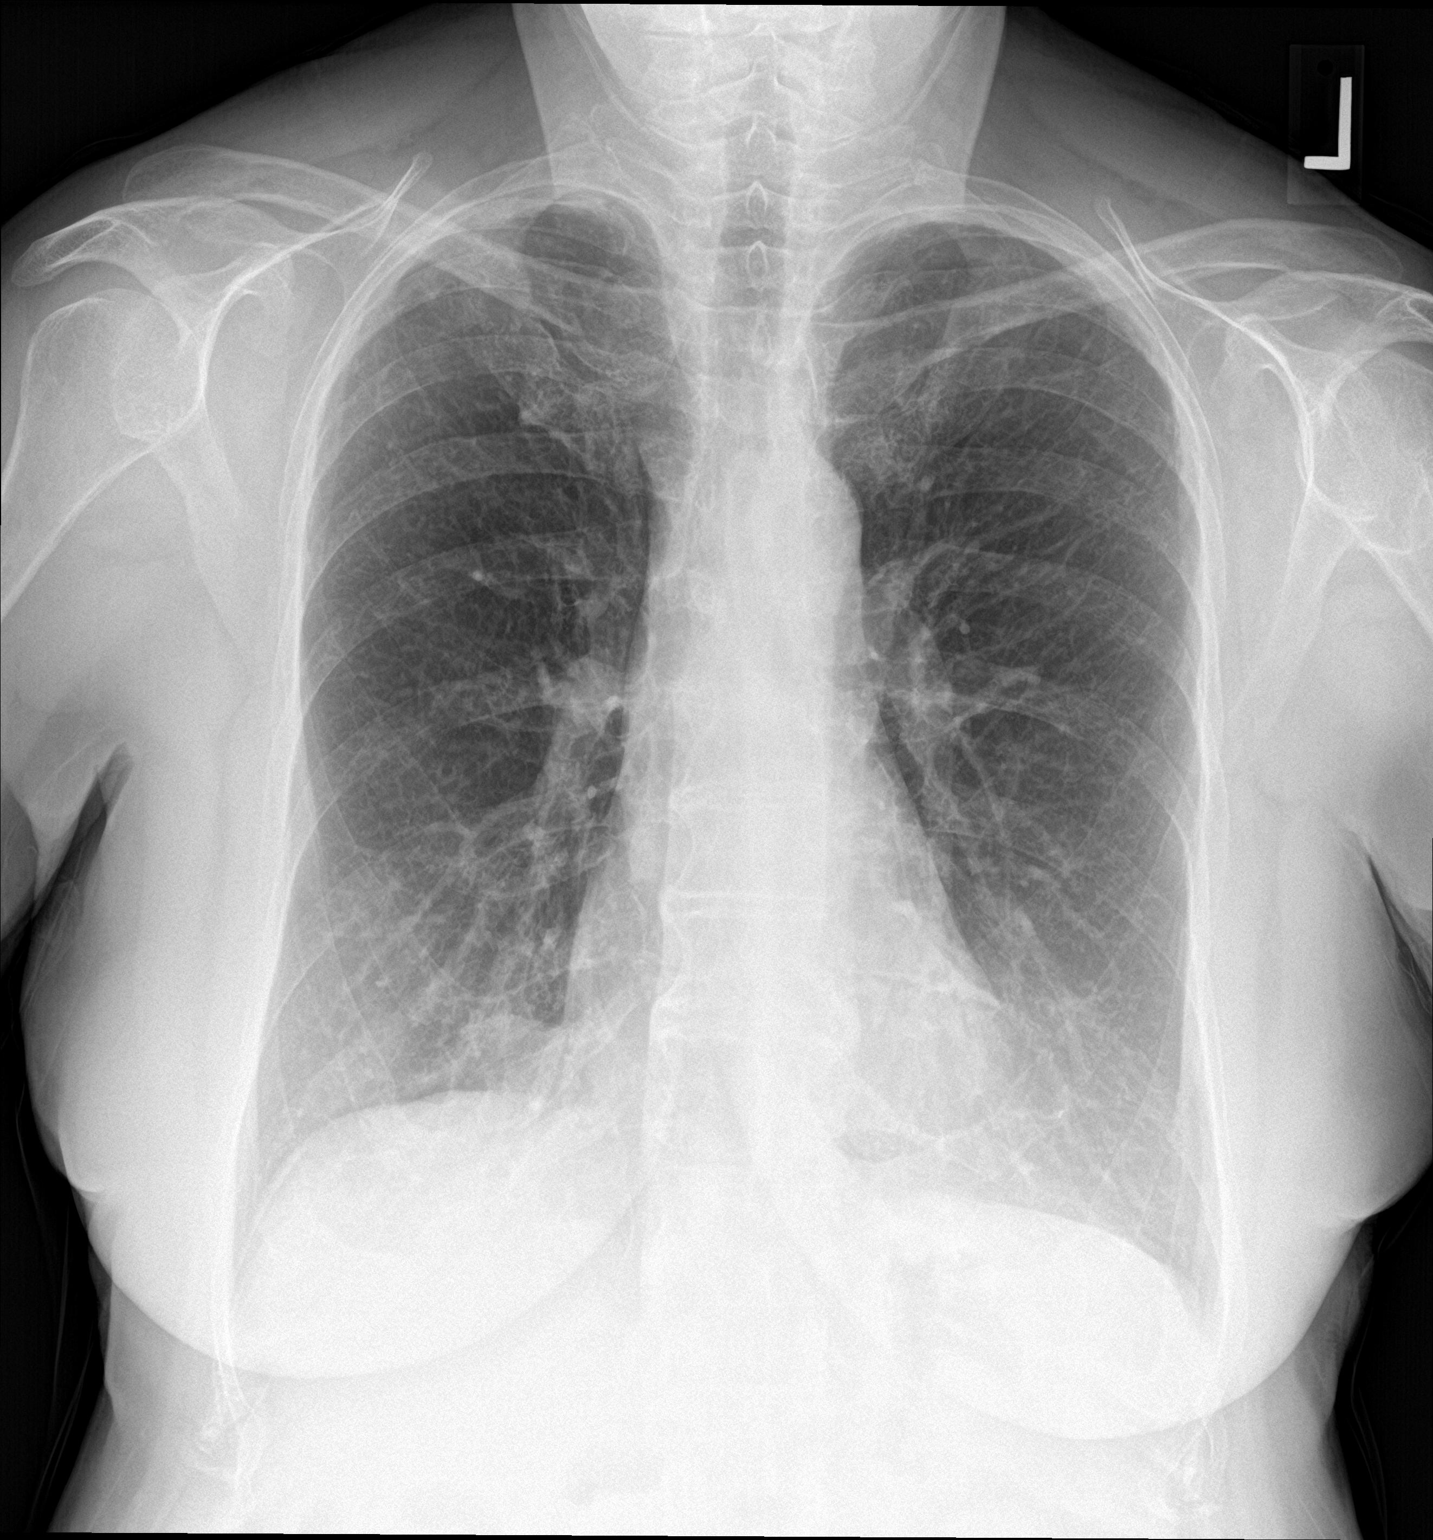

[chest lat]
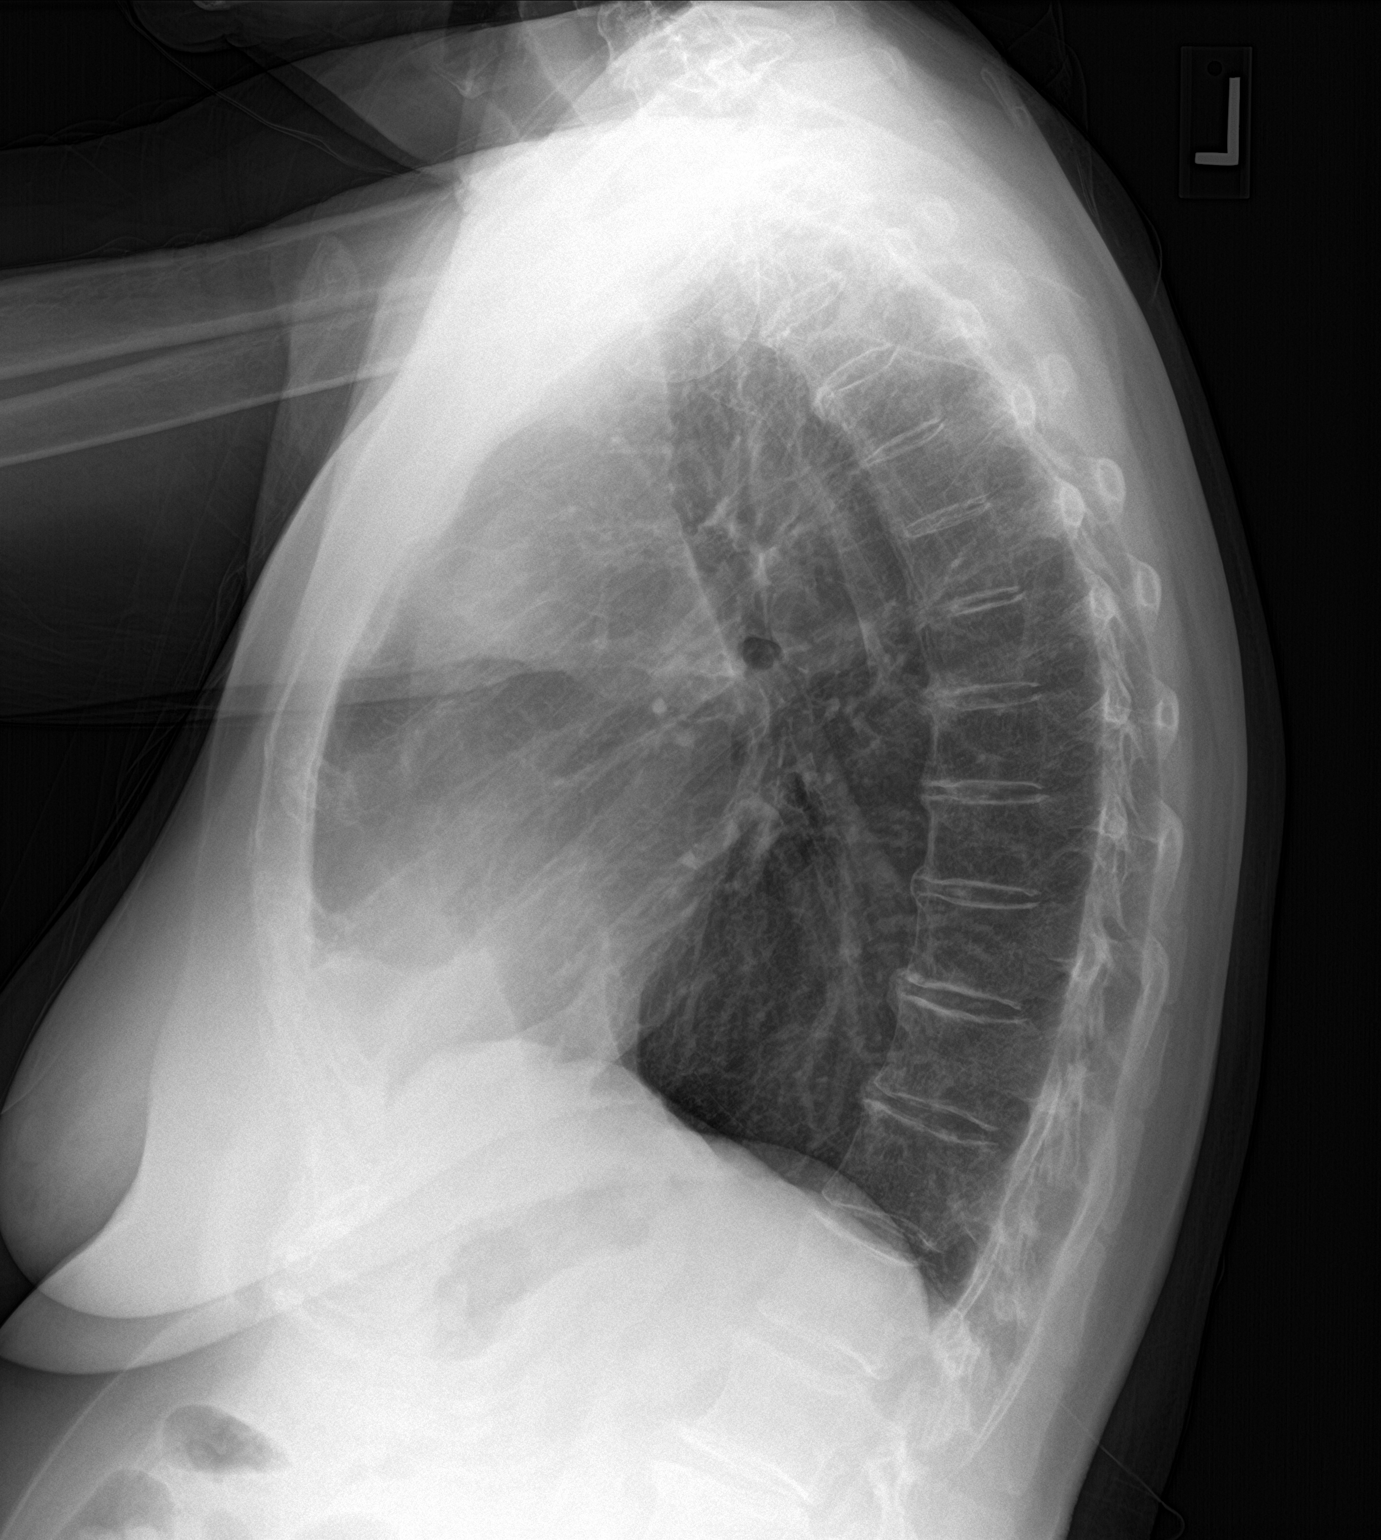

[2 of 2 positions shown; findings below may reference images not displayed]

FINDINGS: The heart size and mediastinal contours are within normal limits.
Both lungs are clear. No visible pleural effusions or pneumothorax.
Biapical pleuroparenchymal scarring. Chronic hyperinflation. No
acute osseous abnormality. Thoracic spine degenerative change.
IMPRESSION: 1. No active cardiopulmonary disease.
2. Chronic hyperinflation, suggesting emphysema/COPD.

## 2023-03-22 IMAGING — US US EXTREM LOW VENOUS*L*
1 series · 13 of 24 positions shown · non-contrast
Comparison: None.

CLINICAL DATA: Intermittent left lower extremity pain for the past
day. Evaluate for DVT.



[Series 1: us venous img lower uni left (dvt) · portal-venous · 13 of 40 slices shown]
[im 1/40]
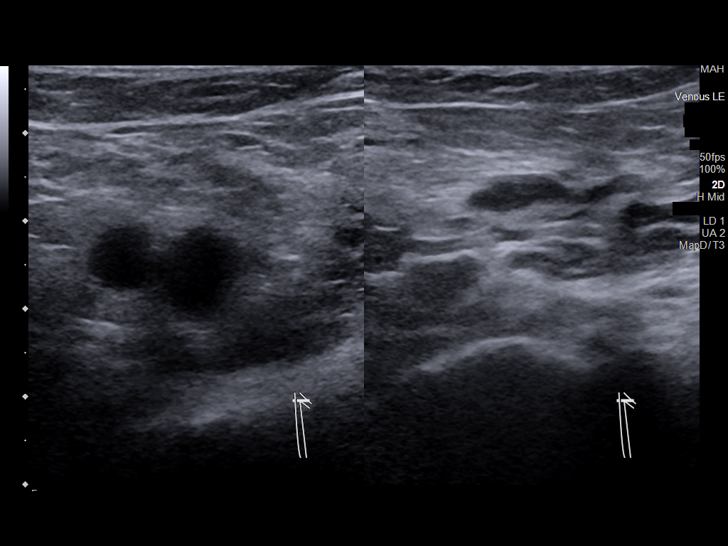
[im 4/40]
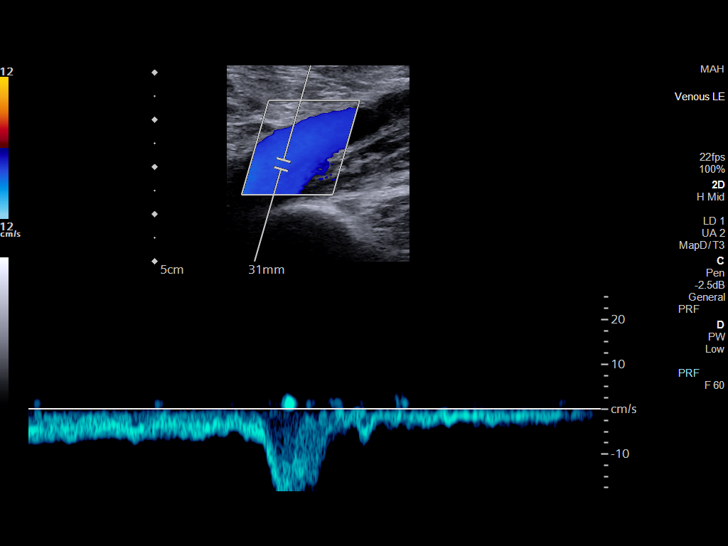
[im 7/40]
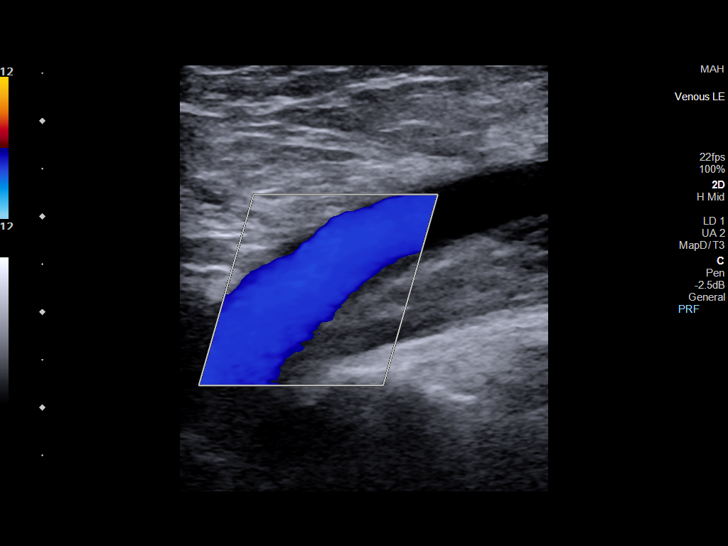
[im 11/40]
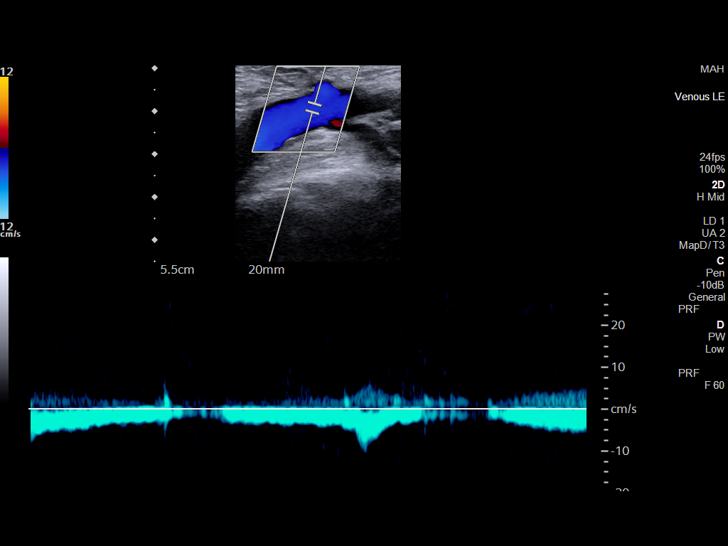
[im 14/40]
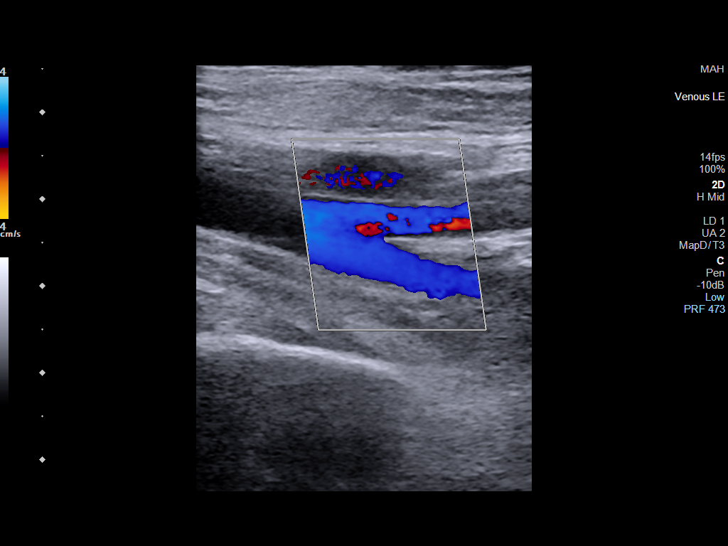
[im 17/40]
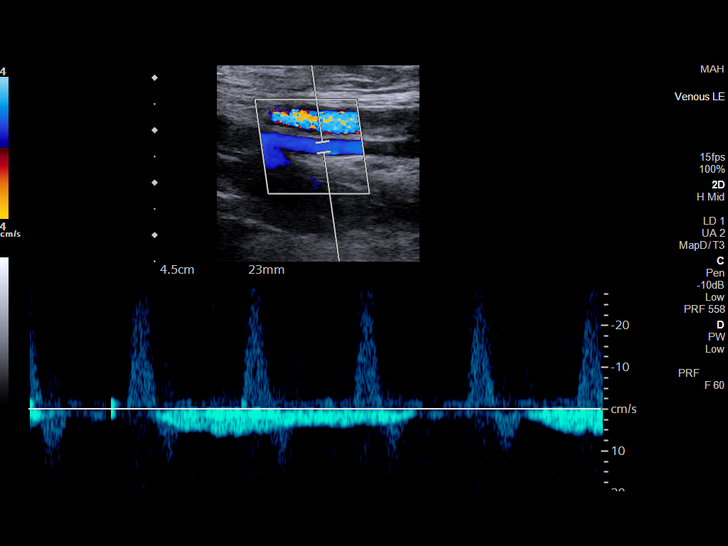
[im 21/40]
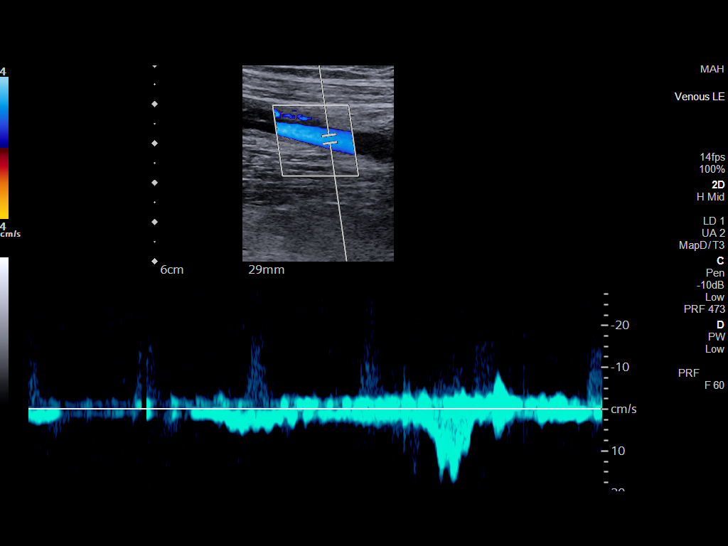
[im 23/40]
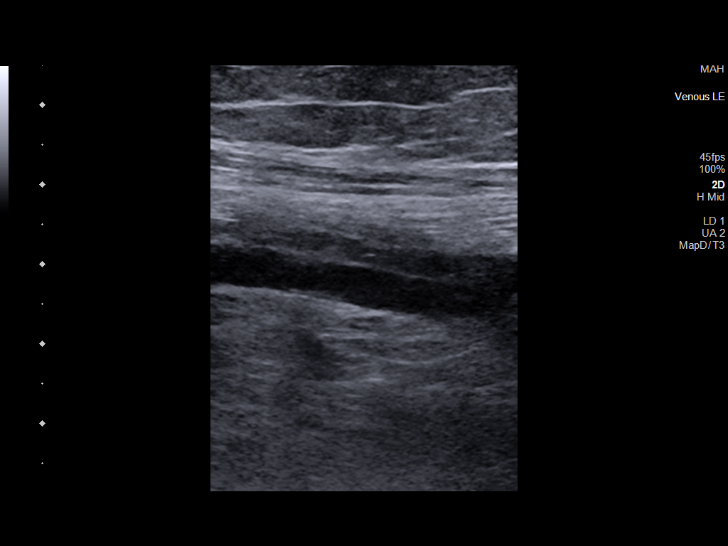
[im 26/40]
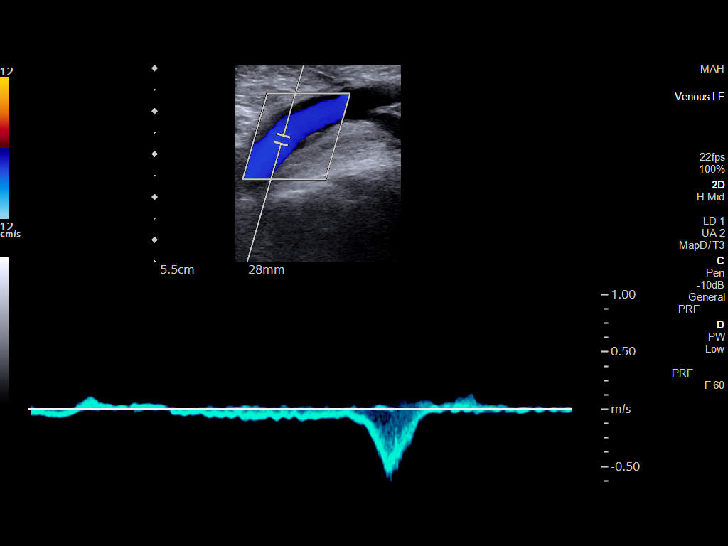
[im 29/40]
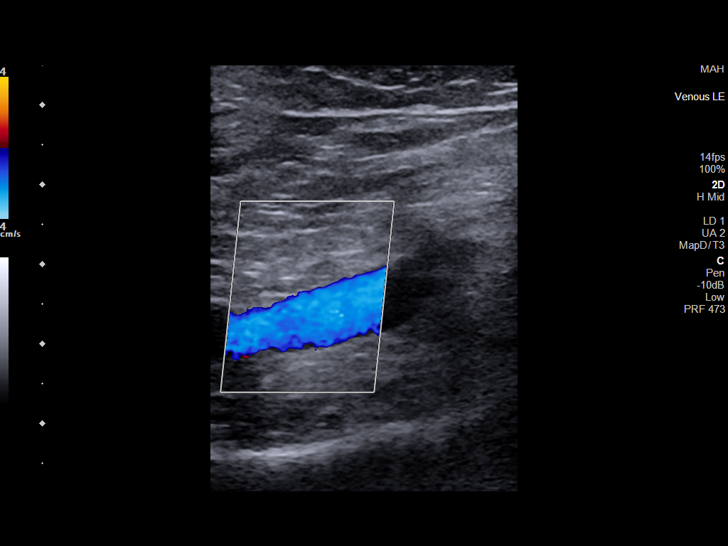
[im 33/40]
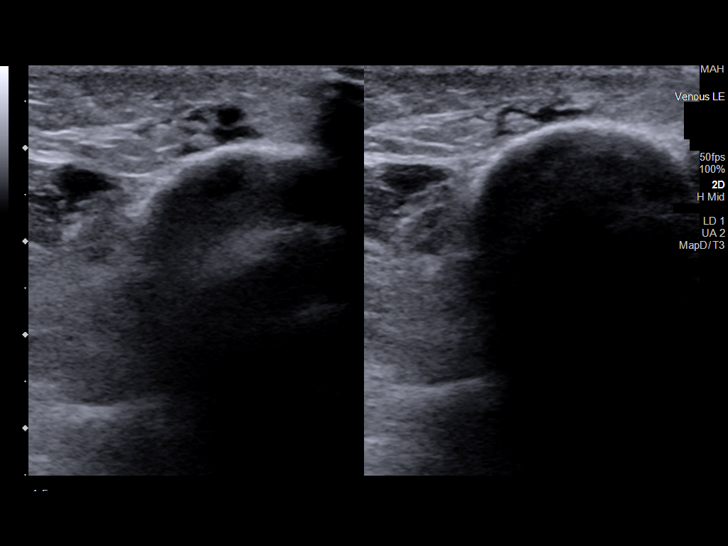
[im 36/40]
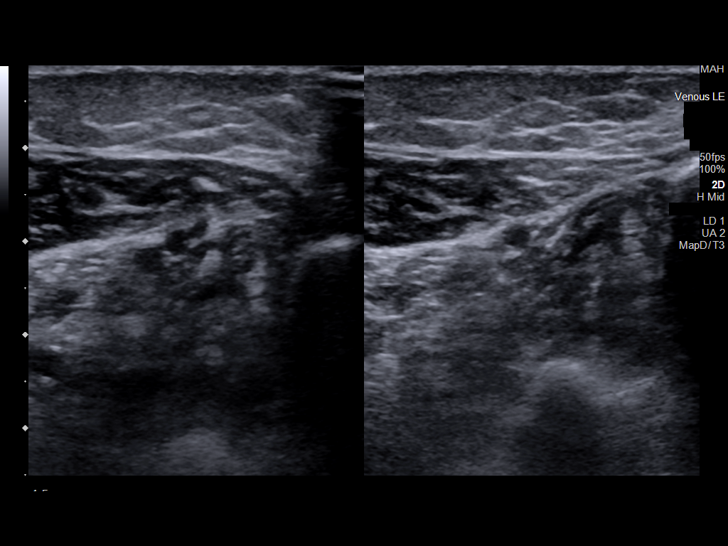
[im 40/40]
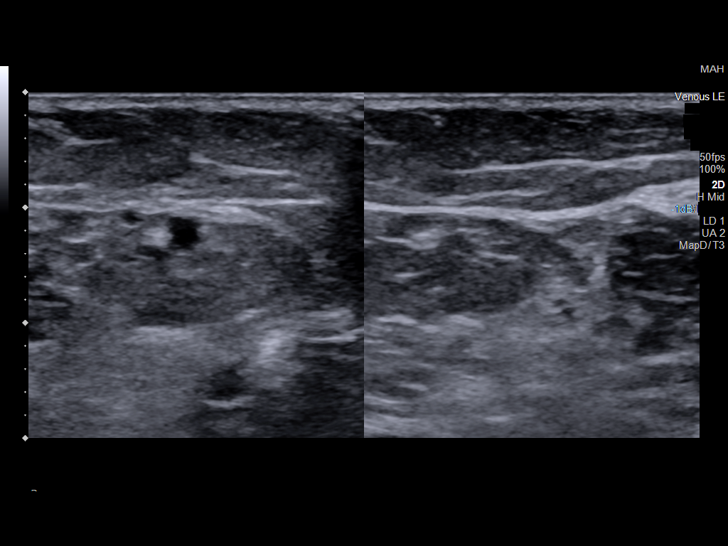

[13 of 24 positions shown; findings below may reference images not displayed]

FINDINGS: Contralateral Common Femoral Vein: Respiratory phasicity is normal
and symmetric with the symptomatic side. No evidence of thrombus.
Normal compressibility.

Common Femoral Vein: No evidence of thrombus. Normal
compressibility, respiratory phasicity and response to augmentation.

Saphenofemoral Junction: No evidence of thrombus. Normal
compressibility and flow on color Doppler imaging.

Profunda Femoral Vein: No evidence of thrombus. Normal
compressibility and flow on color Doppler imaging.

Femoral Vein: No evidence of thrombus. Normal compressibility,
respiratory phasicity and response to augmentation.

Popliteal Vein: No evidence of thrombus. Normal compressibility,
respiratory phasicity and response to augmentation.

Calf Veins: No evidence of thrombus. Normal compressibility and flow
on color Doppler imaging.

Superficial Great Saphenous Vein: No evidence of thrombus. Normal
compressibility.

Venous Reflux:  None.

Other Findings:  None.
IMPRESSION: No evidence of DVT within the left lower extremity.
